# Patient Record
Sex: Male | Born: 1960 | Race: Black or African American | Hispanic: No | Marital: Single | Smoking: Current every day smoker
Health system: Southern US, Community
[De-identification: ages and names within clinical notes are randomized; demographics above are authoritative.]

## PROBLEM LIST (undated history)

## (undated) DIAGNOSIS — M1990 Unspecified osteoarthritis, unspecified site: Secondary | ICD-10-CM

## (undated) DIAGNOSIS — Z915 Personal history of self-harm: Secondary | ICD-10-CM

## (undated) DIAGNOSIS — F1910 Other psychoactive substance abuse, uncomplicated: Secondary | ICD-10-CM

## (undated) DIAGNOSIS — F32A Depression, unspecified: Secondary | ICD-10-CM

## (undated) DIAGNOSIS — K219 Gastro-esophageal reflux disease without esophagitis: Secondary | ICD-10-CM

## (undated) DIAGNOSIS — Z9151 Personal history of suicidal behavior: Secondary | ICD-10-CM

## (undated) DIAGNOSIS — I10 Essential (primary) hypertension: Secondary | ICD-10-CM

## (undated) DIAGNOSIS — F329 Major depressive disorder, single episode, unspecified: Secondary | ICD-10-CM

## (undated) HISTORY — PX: HYPOSPADIAS CORRECTION: SHX483

---

## 1998-09-10 ENCOUNTER — Emergency Department (HOSPITAL_COMMUNITY): Admission: EM | Admit: 1998-09-10 | Discharge: 1998-09-10 | Payer: Self-pay | Admitting: Emergency Medicine

## 2006-07-03 ENCOUNTER — Inpatient Hospital Stay (HOSPITAL_COMMUNITY): Admission: EM | Admit: 2006-07-03 | Discharge: 2006-07-05 | Payer: Self-pay | Admitting: Emergency Medicine

## 2006-07-06 ENCOUNTER — Emergency Department (HOSPITAL_COMMUNITY): Admission: EM | Admit: 2006-07-06 | Discharge: 2006-07-06 | Payer: Self-pay | Admitting: Emergency Medicine

## 2009-11-23 ENCOUNTER — Emergency Department (HOSPITAL_COMMUNITY): Admission: EM | Admit: 2009-11-23 | Discharge: 2009-11-23 | Payer: Self-pay | Admitting: Emergency Medicine

## 2010-02-04 ENCOUNTER — Emergency Department (HOSPITAL_COMMUNITY): Admission: EM | Admit: 2010-02-04 | Discharge: 2010-02-04 | Payer: Self-pay | Admitting: Emergency Medicine

## 2010-09-13 LAB — DIFFERENTIAL
Basophils Absolute: 0 10*3/uL (ref 0.0–0.1)
Lymphocytes Relative: 33 % (ref 12–46)
Monocytes Absolute: 0.5 10*3/uL (ref 0.1–1.0)
Monocytes Relative: 8 % (ref 3–12)

## 2010-09-13 LAB — ETHANOL: Alcohol, Ethyl (B): <5 mg/dL (ref 0–10)

## 2010-09-13 LAB — CBC
HCT: 40.3 % (ref 39.0–52.0)
Hemoglobin: 13.3 g/dL (ref 13.0–17.0)
Platelets: 458 10*3/uL — ABNORMAL HIGH (ref 150–400)
RBC: 4.73 MIL/uL (ref 4.22–5.81)

## 2010-09-13 LAB — RAPID URINE DRUG SCREEN, HOSP PERFORMED
Barbiturates: NOT DETECTED
Benzodiazepines: POSITIVE — AB
Cocaine: POSITIVE — AB
Tetrahydrocannabinol: POSITIVE — AB

## 2010-09-13 LAB — POCT I-STAT, CHEM 8
BUN: 14 mg/dL (ref 6–23)
Chloride: 108 mEq/L (ref 96–112)
Creatinine, Ser: 1 mg/dL (ref 0.4–1.5)
Glucose, Bld: 86 mg/dL (ref 70–99)

## 2010-11-15 NOTE — Op Note (Signed)
NAMEJERMAIN, [name] NO.:  [id number]   MEDICAL RECORD NO.:  [id number]          PATIENT TYPE:  EMS   LOCATION:  MAJO                         FACILITY:  [facility]   PHYSICIAN:  Vanita Panda. Magnus Ivan, M.D.DATE OF BIRTH:  Jun 22, 1961   DATE OF PROCEDURE:  07/03/2006  DATE OF DISCHARGE:                               OPERATIVE REPORT   PREOPERATIVE DIAGNOSIS:  Chain saw injury to the left knee with open  proximal tibia fracture and ruptured patellar tendon.   POSTOPERATIVE DIAGNOSIS:  Chain saw injury to the left knee with open  proximal tibia fracture and ruptured patellar tendon.   PROCEDURE:  1. Thorough irrigation and debridement of left open knee wound status      post chain saw injury.  2. Direct primary repair of ruptured patella tendon, left knee.   SURGEON:  Vanita Panda. Magnus Ivan, M.D.   ANESTHESIA:  General.   ANTIBIOTIC:  600 mg IV clindamycin.   BLOOD LOSS:  50 mL.   COMPLICATIONS:  None.   INDICATIONS:  Briefly, [name] [name] is a 50 year old who was cutting  trees with a chain saw today when the chain saw jerked back and he  sustained a chain saw injury to his left knee.  This was an open  contaminated wound.  He was seen in the emergency room. He had a small  chunk of bone taken out the anterior cortex of the proximal tibia near  the plateau but outside of the joint.  This did not propagate the  fracture.  However, there was a complete laceration of the patellar  tendon at this level.  There was gross contamination of the wound.  It  was recommended he undergo thorough irrigation and debridement with  repair of the patellar tendon.  There was no surgical repair warranted  of the anterior cortex injury of the proximal tibia.  The risks and  benefits of this were explained to him and he understood and agreed to  proceed with surgery.   PROCEDURE DESCRIPTION:  After informed consent was obtained, the  appropriate left leg was marked, Mr.  [name] was brought to the  operating room and placed supine on the operating table.  General  anesthesia was then obtained. A nonsterile tourniquet was placed around  his upper left leg but was never utilized during the case.  His leg was  prepped and draped with Betadine scrub and paint as well as a sterile  stockinette and sterile dressings.  I then used forceps to take out  debris from the wound.  Once this was thoroughly done I copiously  irrigated the wound using pulsatile lavage and 1000 mL of bacitracin  solution followed by 3 liters of normal saline solution using pulsatile  lavage, as well.  Once this was thoroughly cleaned, it was noted that  the laceration was in the distal mid substance of the patellar tendon  with its attachments still maintained to the bone.  I was able to run a  #2 FiberWire suture in an interrupted format to bring the ends of the  tendon together securely. This was then oversewn with  0  Vicryl suture on the margins.  The wound was copiously irrigated again  and I closed the skin with interrupted 2-0 nylon suture.  Xeroform  followed by a well-padded sterile dressing were applied and the  patient's knee was placed in a knee immobilizer.  He was awakened,  extubated, and taken to the recovery room in stable condition.           ______________________________  Vanita Panda. Magnus Ivan, M.D.     CYB/MEDQ  D:  07/03/2006  T:  07/03/2006  Job:  [id number]

## 2010-11-15 NOTE — Discharge Summary (Signed)
NAMEOSWIN, GRIFFITH NO.:  [id number]   MEDICAL RECORD NO.:  [id number]          PATIENT TYPE:  INP   LOCATION:  5030                         FACILITY:  [facility]   PHYSICIAN:  Vanita Panda. Magnus Ivan, M.D.DATE OF BIRTH:  [birth year]   DATE OF ADMISSION:  07/03/2006  DATE OF DISCHARGE:  07/05/2006                               DISCHARGE SUMMARY   ADMITTING DIAGNOSES:  Chainsaw injury to left knee with open proximal  tibia fracture and ruptured patellar tendon.   DISCHARGE DIAGNOSES:  Chainsaw injury to left knee with open proximal  tibia fracture and ruptured patellar tendon.   PROCEDURES:  1. Left knee wound exploration.  2. Thorough irrigation and debridement of left knee wound.  3. Direct primary repair of ruptured left patellar tendon.   HOSPITAL COURSE:  Mr. Schrieber is a 50 year old who was admitted on  January 4 after having a change saw injury to his left knee.  He  sustained the aforementioned injuries and was taken to the operating  room on the day of admission for cleaning the wound and repairing the  soft tissues. For description of the operation, please refer to the  dictated operative note in the patient's medical record.  Postoperatively, I admitted him for IV antibiotics and then kept him an  extra day due to the need for IV antibiotics from the gross  contamination of the wound. By the day of discharge, he was up with  physical therapy with a knee immobilizer and weightbearing as tolerated  but no bending of the knee.  The wound was assessed and found to be  clean, dry and intact, and he was stable for discharge to home.   DISPOSITION:  Home.   DISCHARGE MEDICATIONS:  1. Percocet as needed for pain.  2. Robaxin as needed for spasms.  3. Keflex 500 mg 4 times daily for 1 week.   DISCHARGE INSTRUCTIONS:  1. While at home, he can look at his wound daily to assess it for      drainage, and he should wear the knee immobilizer at all times,   especially when ambulating so as not to bend the knee.  2. Followup will be established in the office in 2 weeks.           ______________________________  Vanita Panda. Magnus Ivan, M.D.     CYB/MEDQ  D:  07/05/2006  T:  07/05/2006  Job:  [id number]

## 2010-11-15 NOTE — Discharge Summary (Signed)
[name], [name] NO.:  [id number]   MEDICAL RECORD NO.:  [id number]          PATIENT TYPE:  INP   LOCATION:  5030                         FACILITY:  [facility]   PHYSICIAN:  Vanita Panda. Magnus Ivan, M.D.DATE OF BIRTH:  09-Feb-1961   DATE OF ADMISSION:  07/03/2006  DATE OF DISCHARGE:  07/05/2006                               DISCHARGE SUMMARY   ADMITTING DIAGNOSIS:  Left open knee wound with patellar tendon rupture  status post chainsaw injury.   DISCHARGE DIAGNOSIS:  Left open knee wound with patellar tendon rupture  status post chainsaw injury.   PROCEDURES:  1. Irrigation and debridement of open left knee wound.  2. Left patellar tendon repair on the day of admission.   HOSPITAL COURSE:  [name] is a 50 year old who was cutting down  trees.  He slipped, and the chainsaw slipped, and he suffered an open  knee wound from the chainsaw.  He was found to have a complete ruptured  tendon with an open injury, so it was recommended he undergo irrigation  and debridement and fixation of his tendon injury.  The risks and  benefits of this were explained to him.  He well understood, and he  agreed to proceed with surgery.  He was taken to the operating room, and  an I and D was performed as well as a left patellar tendon repair.  For  a detailed description of the operation, please refer to the history and  physical and operative note on the patient in the record.  Postoperatively I will ask him to follow up with physical therapy and a  knee immobilizer with weightbearing as tolerated in the immobilizer.  He  remained on IV antibiotics until discharge.  He was transitioned to oral  antibiotics.  His hospital course was uneventful, and he was discharged  to home in stable condition.   DISPOSITION:  To home.   DISCHARGE MEDICATIONS:  Oral antibiotic and oral pain medications.   DISCHARGE INSTRUCTIONS:  While he is at home, dry dressing changes will  be  performed daily on his leg, so he can look at the wound.  He can  weight bear as tolerated in the knee immobilizer, but under no  circumstances should he bend his knee.  Follow up will be established in  my office in 2 weeks.           ______________________________  Vanita Panda. Magnus Ivan, M.D.     CYB/MEDQ  D:  08/07/2006  T:  08/08/2006  Job:  [id number]

## 2011-07-14 ENCOUNTER — Encounter (HOSPITAL_COMMUNITY): Payer: Self-pay | Admitting: Emergency Medicine

## 2011-07-14 ENCOUNTER — Emergency Department (HOSPITAL_COMMUNITY)
Admission: EM | Admit: 2011-07-14 | Discharge: 2011-07-14 | Disposition: A | Discharge status: Rehabilitation Facility | Payer: Self-pay | Attending: Emergency Medicine | Admitting: Emergency Medicine

## 2011-07-14 DIAGNOSIS — M129 Arthropathy, unspecified: Secondary | ICD-10-CM | POA: Insufficient documentation

## 2011-07-14 DIAGNOSIS — F1410 Cocaine abuse, uncomplicated: Secondary | ICD-10-CM | POA: Insufficient documentation

## 2011-07-14 DIAGNOSIS — F1910 Other psychoactive substance abuse, uncomplicated: Secondary | ICD-10-CM

## 2011-07-14 DIAGNOSIS — F1010 Alcohol abuse, uncomplicated: Secondary | ICD-10-CM | POA: Insufficient documentation

## 2011-07-14 HISTORY — DX: Unspecified osteoarthritis, unspecified site: M19.90

## 2011-07-14 LAB — RAPID URINE DRUG SCREEN, HOSP PERFORMED
Amphetamines: NOT DETECTED
Barbiturates: NOT DETECTED
Benzodiazepines: NOT DETECTED
Cocaine: POSITIVE — AB
Opiates: NOT DETECTED
Tetrahydrocannabinol: POSITIVE — AB

## 2011-07-14 LAB — POCT I-STAT, CHEM 8
BUN: 13 mg/dL (ref 6–23)
Chloride: 107 mEq/L (ref 96–112)
HCT: 44 % (ref 39.0–52.0)
TCO2: 24 mmol/L (ref 0–100)

## 2011-07-14 LAB — ETHANOL: Alcohol, Ethyl (B): <11 mg/dL (ref 0–11)

## 2011-07-14 MED ORDER — ACETAMINOPHEN 325 MG PO TABS: 650.0000 mg | ORAL_TABLET | ORAL | Status: DC | PRN

## 2011-07-14 MED ORDER — IBUPROFEN 200 MG PO TABS
600.0000 mg | ORAL_TABLET | Freq: Three times a day (TID) | ORAL | Status: DC | PRN
  Administered 2011-07-14: 600 mg via ORAL
  Filled 2011-07-14: qty 3

## 2011-07-14 MED ORDER — LORAZEPAM 1 MG PO TABS: 1.0000 mg | ORAL_TABLET | Freq: Three times a day (TID) | ORAL | Status: DC | PRN

## 2011-07-14 MED ORDER — ZOLPIDEM TARTRATE 5 MG PO TABS: 5.0000 mg | ORAL_TABLET | Freq: Every evening | ORAL | Status: DC | PRN

## 2011-07-14 MED ORDER — NICOTINE 21 MG/24HR TD PT24
21.0000 mg | MEDICATED_PATCH | Freq: Every day | TRANSDERMAL | Status: DC
  Administered 2011-07-14: 21 mg via TRANSDERMAL
  Filled 2011-07-14: qty 1

## 2011-07-14 MED ORDER — ALUM & MAG HYDROXIDE-SIMETH 200-200-20 MG/5ML PO SUSP: 30.0000 mL | ORAL | Status: DC | PRN

## 2011-07-14 MED ORDER — ONDANSETRON HCL 8 MG PO TABS: 4.0000 mg | ORAL_TABLET | Freq: Three times a day (TID) | ORAL | Status: DC | PRN

## 2011-07-14 NOTE — ED Notes (Signed)
Pt requesting detox from cocaine(crack), marijuana and ETOH; pt sts last use was yesterday; pt sts detoxed at [facility] 2 years ago

## 2011-07-14 NOTE — BH Assessment (Signed)
Assessment Note   [name] [name] is an 51 y.o. male that presents to the ED requesting detox from numerous substances, including ETOH, Cannibus, and Crack.  Pt was treated for same at [facility] in 2011 but never returned for outpatient follow-up.  Pt reports that use has worsened over last year and that he again needs inpatient treatment.  Pt denies SI, HI, or any active psychosis.  Pt is not followed by a Psychiatrist or therapist.  Pt does have legal charges, which include concealment of weapon and goes to court on January 29th.  Pt reports withdrawals including itchiness, irritability, and cravings.  His current CIWA score is 7.  Pt is able to contract for safety.   Please run for inpatient treatment.   Axis I: Rule out Substance dependence Axis II: Deferred Axis III:  Past Medical History  Diagnosis Date  . Arthritis    Axis IV: housing problems, problems related to legal system/crime, problems related to social environment, problems with access to health care services and problems with primary support group Axis V: 21-30 behavior considerably influenced by delusions or hallucinations OR serious impairment in judgment, communication OR inability to function in almost all areas  Past Medical History:  Past Medical History  Diagnosis Date  . Arthritis     History reviewed. No pertinent past surgical history.  Family History: History reviewed. No pertinent family history.  Social History:  reports that he has been smoking.  He does not have any smokeless tobacco history on file. He reports that he drinks alcohol. He reports that he uses illicit drugs (Cocaine and Marijuana).  Additional Social History:  Alcohol / Drug Use Pain Medications: 0 Prescriptions: 0 Over the Counter: 0 History of alcohol / drug use?: Yes Longest period of sobriety (when/how long): year Negative Consequences of Use: Financial;Legal;Personal relationships;Work / School Withdrawal Symptoms: Irritability;Patient  aware of relationship between substance abuse and physical/medical complications;Tingling Substance #1 Name of Substance 1: ETOH 1 - Age of First Use: teens 1 - Amount (size/oz): 8+ 1 - Frequency: QD 1 - Duration: year + 1 - Last Use / Amount: last nite Substance #2 Name of Substance 2: Cannibus 2 - Age of First Use: teen 2 - Amount (size/oz): up to 6 blunts 2 - Frequency: QD 2 - Duration: year + 2 - Last Use / Amount: last nite Substance #3 Name of Substance 3: Crack 3 - Age of First Use: 30 3 - Amount (size/oz): up to 1 gram 3 - Frequency: QD 3 - Duration: year + 3 - Last Use / Amount: last night Allergies: No Known Allergies  Home Medications:  No current facility-administered medications on file as of 07/14/2011.   No current outpatient prescriptions on file as of 07/14/2011.    OB/GYN Status:  No LMP for male patient.  General Assessment Data Location of Assessment: [facility] ED ACT Assessment: Yes Living Arrangements: Homeless Can pt return to current living arrangement?: No Admission Status: Voluntary Is patient capable of signing voluntary admission?: Yes Transfer from: Acute Hospital Referral Source: MD  Education Status Is patient currently in school?: No  Risk to self Suicidal Ideation: No Suicidal Intent: No Is patient at risk for suicide?: No Suicidal Plan?: No Access to Means: No What has been your use of drugs/alcohol within the last 12 months?: Cannibus, Alcohol, and Crack Previous Attempts/Gestures: No How many times?: 0  Other Self Harm Risks: impulsive, reckless Triggers for Past Attempts: Unpredictable Intentional Self Injurious Behavior: None Family Suicide History: No Recent  stressful life event(s): Financial Problems;Legal Issues;Conflict (Comment);Recent negative physical changes Persecutory voices/beliefs?: No Depression: Yes Depression Symptoms: Feeling worthless/self pity;Loss of interest in usual pleasures;Guilt Substance abuse history  and/or treatment for substance abuse?: Yes Suicide prevention information given to non-admitted patients: Not applicable  Risk to Others Homicidal Ideation: No Thoughts of Harm to Others: No Current Homicidal Intent: No Current Homicidal Plan: No Access to Homicidal Means: No History of harm to others?: Yes Assessment of Violence: In distant past Violent Behavior Description: charged with concealed weapons/ stole and fought to sustain habit Does patient have access to weapons?: Yes (Comment) Criminal Charges Pending?: Yes Describe Pending Criminal Charges: Concealed weapons charge Does patient have a court date: Yes Court Date: 07/29/11  Psychosis Hallucinations: None noted Delusions: None noted  Mental Status Report Eye Contact: Good Motor Activity: Unremarkable Speech: Logical/coherent Level of Consciousness: Alert Mood: Ambivalent Affect: Apathetic Anxiety Level: Minimal Thought Processes: Relevant Judgement: Impaired Orientation: Person;Place;Time;Situation Obsessive Compulsive Thoughts/Behaviors: Moderate  Cognitive Functioning Concentration: Decreased Memory: Recent Impaired;Remote Impaired IQ: Average Insight: Poor Impulse Control: Poor Appetite: Good Weight Loss: 0  Weight Gain: 0  Sleep: No Change Total Hours of Sleep: 5  Vegetative Symptoms: None  Prior Inpatient Therapy Prior Inpatient Therapy: Yes Prior Therapy Dates: 2011 Prior Therapy Facilty/Provider(s): [facility] Reason for Treatment: SA  Prior Outpatient Therapy Prior Outpatient Therapy: Yes Prior Therapy Dates: 2011 Prior Therapy Facilty/Provider(s): [facility] Reason for Treatment: SA            Values / Beliefs Cultural Requests During Hospitalization: None Spiritual Requests During Hospitalization: None        Additional Information 1:1 In Past 12 Months?: No CIRT Risk: No Elopement Risk: No Does patient have medical clearance?: Yes     Disposition:   Disposition Disposition of Patient: Referred to Patient referred to: Other (Comment);[facility]  On Site Evaluation by:   Reviewed with Physician:     Angelica Ran 07/14/2011 3:39 PM

## 2011-07-14 NOTE — ED Provider Notes (Signed)
8:25 PM is alert pleasant cooperative ambulatory, stable for discharge and transfer to Oley Balm, MD 07/14/11 2030

## 2011-07-14 NOTE — ED Provider Notes (Signed)
History     CSN: [id number]  Arrival date & time 07/14/11  4034   First MD Initiated Contact with Patient 07/14/11 1004      Chief Complaint  Patient presents with  . Medical Clearance    (Consider location/radiation/quality/duration/timing/severity/associated sxs/prior treatment) HPI A. she is here for detox from cocaine crack, and alcohol.  Patient states that he has got a long history of drug and alcohol abuse.  States he has had detox in the past [facility] 2 years ago.  Patient denies any other symptoms.  No chest pain, shortness of breath, weakness, vomiting, diarrhea, nausea, abdominal pain,  visual changes, shaking or hallucinations.  Past Medical History  Diagnosis Date  . Arthritis     History reviewed. No pertinent past surgical history.  History reviewed. No pertinent family history.  History  Substance Use Topics  . Smoking status: Current Everyday Smoker  . Smokeless tobacco: Not on file  . Alcohol Use: Yes      Review of Systems All pertinent positives and negatives reviewed in the history of present illness  Allergies  Review of patient's allergies indicates no known allergies.  Home Medications   Current Outpatient Rx  Name Route Sig Dispense Refill  . NYQUIL MULTI-SYMPTOM PO Oral Take 2 tablets by mouth once.      BP 141/88  Pulse 83  Temp(Src) 98.2 F (36.8 C) (Oral)  Resp 20  SpO2 94%  Physical Exam  Constitutional: He is oriented to person, place, and time. He appears well-developed and well-nourished. No distress.  HENT:  Head: Normocephalic and atraumatic.  Eyes: Pupils are equal, round, and reactive to light.  Neck: Normal range of motion. Neck supple.  Cardiovascular: Normal rate, regular rhythm and normal heart sounds.   Pulmonary/Chest: Effort normal and breath sounds normal.  Abdominal: Soft. Bowel sounds are normal. He exhibits no distension. There is no tenderness. There is no rebound and no guarding.  Neurological: He is alert  and oriented to person, place, and time.  Skin: Skin is dry. No rash noted.  Psychiatric: He has a normal mood and affect. His behavior is normal. Judgment and thought content normal.    ED Course  Procedures (including critical care time)  Labs Reviewed  URINE RAPID DRUG SCREEN (HOSP PERFORMED) - Abnormal; Notable for the following:    Cocaine POSITIVE (*)    Tetrahydrocannabinol POSITIVE (*)    All other components within normal limits  POCT I-STAT, CHEM 8 - Abnormal; Notable for the following:    Glucose, Bld 105 (*)    All other components within normal limits  ETHANOL  I-STAT, CHEM 8     Spoke with the accident the patient who will be in to see him in evaluating for possible admission for detox.     MDM  MDM Reviewed: nursing note and vitals Interpretation: labs            Carlyle Dolly, PA-C 07/14/11 1547

## 2011-07-14 NOTE — ED Notes (Signed)
Pt given Sprite to drink and crackers to eat, no further needs.

## 2011-07-14 NOTE — ED Notes (Signed)
[facility] transfer arranged by [facility] 

## 2011-07-14 NOTE — ED Notes (Signed)
[facility] transfer arranged by [facility]

## 2011-07-14 NOTE — ED Notes (Signed)
[facility] here to transport patient  Nicotine patch removed

## 2011-07-14 NOTE — ED Provider Notes (Signed)
Medical screening examination/treatment/procedure(s) were performed by non-physician practitioner and as supervising physician I was immediately available for consultation/collaboration.   Gwyneth Sprout, MD 07/14/11 1558

## 2011-07-14 NOTE — BHH Counselor (Signed)
Update @ 1830:  Called [facility] in reference to pt referral.  Per Arlys John, the nurse at [facility], pt accepted to [facility] and would be picked up from [facility] at 9 PM or 9:30 PM.  Updated EDP and ED staff, updated assessment disposition, completed assessment notification and faxed to [facility] to log.

## 2011-08-11 ENCOUNTER — Encounter (HOSPITAL_BASED_OUTPATIENT_CLINIC_OR_DEPARTMENT_OTHER): Payer: Self-pay | Admitting: *Deleted

## 2011-08-11 ENCOUNTER — Emergency Department (HOSPITAL_BASED_OUTPATIENT_CLINIC_OR_DEPARTMENT_OTHER)
Admission: EM | Admit: 2011-08-11 | Discharge: 2011-08-11 | Disposition: A | Discharge status: Home / Self Care | Payer: Self-pay | Attending: Emergency Medicine | Admitting: Emergency Medicine

## 2011-08-11 DIAGNOSIS — F17200 Nicotine dependence, unspecified, uncomplicated: Secondary | ICD-10-CM | POA: Insufficient documentation

## 2011-08-11 DIAGNOSIS — K922 Gastrointestinal hemorrhage, unspecified: Secondary | ICD-10-CM | POA: Insufficient documentation

## 2011-08-11 DIAGNOSIS — Z8739 Personal history of other diseases of the musculoskeletal system and connective tissue: Secondary | ICD-10-CM | POA: Insufficient documentation

## 2011-08-11 LAB — COMPREHENSIVE METABOLIC PANEL
ALT: 33 U/L (ref 0–53)
AST: 18 U/L (ref 0–37)
CO2: 24 mEq/L (ref 19–32)
Calcium: 10 mg/dL (ref 8.4–10.5)
Chloride: 106 mEq/L (ref 96–112)
Creatinine, Ser: 1 mg/dL (ref 0.50–1.35)
GFR calc Af Amer: >90 mL/min (ref >90)
GFR calc non Af Amer: 86 mL/min — ABNORMAL LOW (ref >90)
Glucose, Bld: 109 mg/dL — ABNORMAL HIGH (ref 70–99)
Total Bilirubin: 0.2 mg/dL — ABNORMAL LOW (ref 0.3–1.2)

## 2011-08-11 LAB — CBC
HCT: 39.4 % (ref 39.0–52.0)
Hemoglobin: 13.4 g/dL (ref 13.0–17.0)
MCH: 27.4 pg (ref 26.0–34.0)
MCV: 80.6 fL (ref 78.0–100.0)
Platelets: 515 10*3/uL — ABNORMAL HIGH (ref 150–400)
RBC: 4.89 MIL/uL (ref 4.22–5.81)
RDW: 14.4 % (ref 11.5–15.5)

## 2011-08-11 LAB — DIFFERENTIAL
Basophils Absolute: 0 10*3/uL (ref 0.0–0.1)
Eosinophils Relative: 2 % (ref 0–5)
Lymphocytes Relative: 29 % (ref 12–46)
Lymphs Abs: 2.4 10*3/uL (ref 0.7–4.0)
Monocytes Absolute: 0.8 10*3/uL (ref 0.1–1.0)
Neutro Abs: 4.8 10*3/uL (ref 1.7–7.7)

## 2011-08-11 MED ORDER — OMEPRAZOLE 20 MG PO CPDR: 20.0000 mg | DELAYED_RELEASE_CAPSULE | Freq: Every day | ORAL | Status: DC

## 2011-08-11 MED ORDER — KETOROLAC TROMETHAMINE 30 MG/ML IJ SOLN
INTRAMUSCULAR | Status: AC
  Filled 2011-08-11: qty 1

## 2011-08-11 MED ORDER — KETOROLAC TROMETHAMINE 60 MG/2ML IM SOLN
INTRAMUSCULAR | Status: AC
  Filled 2011-08-11: qty 2

## 2011-08-11 NOTE — ED Provider Notes (Signed)
History     CSN: [id number]  Arrival date & time 08/11/11  0825   First MD Initiated Contact with Patient 08/11/11 [phone]      Chief Complaint  Patient presents with  . Rectal Bleeding    (Consider location/radiation/quality/duration/timing/severity/associated sxs/prior treatment) Patient is a 51 y.o. male presenting with hematochezia. The history is provided by the patient.  Rectal Bleeding  Pertinent negatives include no abdominal pain, no diarrhea, no nausea, no vomiting, no chest pain, no headaches and no rash.   patient states she's had blood in the stool the last 2 weeks. It is both red in the bowl and in stool. He states he also has some with wiping. He states he was a little bit constipated and took some laxative to clear himself out. He states that the clear not in for 2 days he had no blood. It has since returned. No other bleeding. He was a substance abuser has been clean for about a month. He uses alcohol and other drugs heavily. No abdominal pain. No rectal pain.  Past Medical History  Diagnosis Date  . Arthritis     History reviewed. No pertinent past surgical history.  History reviewed. No pertinent family history.  History  Substance Use Topics  . Smoking status: Current Everyday Smoker  . Smokeless tobacco: Not on file  . Alcohol Use: Yes      Review of Systems  Constitutional: Negative for activity change and appetite change.  HENT: Negative for neck stiffness.   Eyes: Negative for pain.  Respiratory: Negative for chest tightness and shortness of breath.   Cardiovascular: Negative for chest pain and leg swelling.  Gastrointestinal: Positive for blood in stool, hematochezia and anal bleeding. Negative for nausea, vomiting, abdominal pain and diarrhea.  Genitourinary: Negative for flank pain.  Musculoskeletal: Negative for back pain.  Skin: Negative for rash.  Neurological: Negative for weakness, numbness and headaches.  Psychiatric/Behavioral: Negative  for behavioral problems.    Allergies  Review of patient's allergies indicates no known allergies.  Home Medications   Current Outpatient Rx  Name Route Sig Dispense Refill  . NYQUIL MULTI-SYMPTOM PO Oral Take 2 tablets by mouth once.      BP 153/83  Pulse 92  Temp(Src) 98.4 F (36.9 C) (Oral)  Resp 20  SpO2 99%  Physical Exam  Nursing note and vitals reviewed. Constitutional: He is oriented to person, place, and time. He appears well-developed and well-nourished.  HENT:  Head: Normocephalic and atraumatic.  Eyes: EOM are normal. Pupils are equal, round, and reactive to light.  Neck: Normal range of motion. Neck supple.  Cardiovascular: Normal rate, regular rhythm and normal heart sounds.   No murmur heard. Pulmonary/Chest: Effort normal and breath sounds normal.  Abdominal: Soft. Bowel sounds are normal. He exhibits no distension and no mass. There is no tenderness. There is no rebound and no guarding.       Scar on left upper abdomen from previous box cutter  Musculoskeletal: Normal range of motion. He exhibits no edema.  Neurological: He is alert and oriented to person, place, and time. No cranial nerve deficit.  Skin: Skin is warm and dry.  Psychiatric: He has a normal mood and affect.   rectal exam showed no gross blood. No hemorrhoids seen. No mass felt  ED Course  Procedures (including critical care time)  Labs Reviewed  CBC - Abnormal; Notable for the following:    Platelets 515 (*)    All other components within normal limits  COMPREHENSIVE METABOLIC PANEL - Abnormal; Notable for the following:    Glucose, Bld 109 (*)    Total Bilirubin 0.2 (*)    GFR calc non Af Amer 86 (*)    All other components within normal limits  DIFFERENTIAL  OCCULT BLOOD X 1 CARD TO LAB, STOOL   No results found.   1. GI bleed       MDM  GI bleeding. Hemoglobin is normal. Platelets is mildly elevated. No clear cause, but patient seems hemodynamically stable. He was  guaiac-negative in ER. He will followup with GI as needed        Juliet Rude. Rubin Payor, MD 08/11/11 [phone]

## 2011-08-11 NOTE — ED Notes (Signed)
Pt amb to room 5 with quick steady gait in nad. Pt reports 2 weeks of intermittant brb with painful stools. Pt denies any bleeding between bm's or any abd pain, fevers, n/v or other c/o

## 2012-06-21 ENCOUNTER — Emergency Department (HOSPITAL_COMMUNITY)
Admission: EM | Admit: 2012-06-21 | Discharge: 2012-06-22 | Disposition: A | Discharge status: Rehabilitation Facility | Payer: Self-pay | Attending: Emergency Medicine | Admitting: Emergency Medicine

## 2012-06-21 ENCOUNTER — Encounter (HOSPITAL_COMMUNITY): Payer: Self-pay | Admitting: *Deleted

## 2012-06-21 DIAGNOSIS — M545 Low back pain, unspecified: Secondary | ICD-10-CM | POA: Insufficient documentation

## 2012-06-21 DIAGNOSIS — F329 Major depressive disorder, single episode, unspecified: Secondary | ICD-10-CM | POA: Insufficient documentation

## 2012-06-21 DIAGNOSIS — F1020 Alcohol dependence, uncomplicated: Secondary | ICD-10-CM | POA: Insufficient documentation

## 2012-06-21 DIAGNOSIS — F1920 Other psychoactive substance dependence, uncomplicated: Secondary | ICD-10-CM | POA: Insufficient documentation

## 2012-06-21 DIAGNOSIS — F1910 Other psychoactive substance abuse, uncomplicated: Secondary | ICD-10-CM

## 2012-06-21 DIAGNOSIS — G8929 Other chronic pain: Secondary | ICD-10-CM | POA: Insufficient documentation

## 2012-06-21 DIAGNOSIS — F17200 Nicotine dependence, unspecified, uncomplicated: Secondary | ICD-10-CM | POA: Insufficient documentation

## 2012-06-21 DIAGNOSIS — Z8739 Personal history of other diseases of the musculoskeletal system and connective tissue: Secondary | ICD-10-CM | POA: Insufficient documentation

## 2012-06-21 DIAGNOSIS — Z79899 Other long term (current) drug therapy: Secondary | ICD-10-CM | POA: Insufficient documentation

## 2012-06-21 DIAGNOSIS — F3289 Other specified depressive episodes: Secondary | ICD-10-CM | POA: Insufficient documentation

## 2012-06-21 DIAGNOSIS — F1010 Alcohol abuse, uncomplicated: Secondary | ICD-10-CM

## 2012-06-21 LAB — COMPREHENSIVE METABOLIC PANEL
AST: 25 U/L (ref 0–37)
Albumin: 3.6 g/dL (ref 3.5–5.2)
BUN: 15 mg/dL (ref 6–23)
Creatinine, Ser: 1.07 mg/dL (ref 0.50–1.35)
Total Protein: 7.4 g/dL (ref 6.0–8.3)

## 2012-06-21 LAB — RAPID URINE DRUG SCREEN, HOSP PERFORMED
Benzodiazepines: NOT DETECTED
Cocaine: POSITIVE — AB
Opiates: NOT DETECTED

## 2012-06-21 LAB — CBC
HCT: 47 % (ref 39.0–52.0)
MCHC: 33.4 g/dL (ref 30.0–36.0)
MCV: 83.6 fL (ref 78.0–100.0)
Platelets: 551 10*3/uL — ABNORMAL HIGH (ref 150–400)
RDW: 15.1 % (ref 11.5–15.5)

## 2012-06-21 LAB — SALICYLATE LEVEL: Salicylate Lvl: <2 mg/dL — ABNORMAL LOW (ref 2.8–20.0)

## 2012-06-21 LAB — ETHANOL: Alcohol, Ethyl (B): <11 mg/dL (ref 0–11)

## 2012-06-21 LAB — ACETAMINOPHEN LEVEL: Acetaminophen (Tylenol), Serum: <15 ug/mL (ref 10–30)

## 2012-06-21 MED ORDER — THIAMINE HCL 100 MG/ML IJ SOLN: 100.0000 mg | Freq: Every day | INTRAMUSCULAR | Status: DC

## 2012-06-21 MED ORDER — LORAZEPAM 1 MG PO TABS: 1.0000 mg | ORAL_TABLET | Freq: Four times a day (QID) | ORAL | Status: DC | PRN

## 2012-06-21 MED ORDER — LORAZEPAM 2 MG/ML IJ SOLN: 1.0000 mg | Freq: Four times a day (QID) | INTRAMUSCULAR | Status: DC | PRN

## 2012-06-21 MED ORDER — ADULT MULTIVITAMIN W/MINERALS CH
1.0000 | ORAL_TABLET | Freq: Every day | ORAL | Status: DC
  Administered 2012-06-21 – 2012-06-22 (×2): 1 via ORAL
  Filled 2012-06-21 (×2): qty 1

## 2012-06-21 MED ORDER — VITAMIN B-1 100 MG PO TABS
100.0000 mg | ORAL_TABLET | Freq: Every day | ORAL | Status: DC
  Administered 2012-06-21 – 2012-06-22 (×2): 100 mg via ORAL
  Filled 2012-06-21 (×3): qty 1

## 2012-06-21 MED ORDER — FOLIC ACID 1 MG PO TABS
1.0000 mg | ORAL_TABLET | Freq: Every day | ORAL | Status: DC
  Administered 2012-06-21 – 2012-06-22 (×2): 1 mg via ORAL
  Filled 2012-06-21 (×2): qty 1

## 2012-06-21 MED ORDER — ONDANSETRON HCL 8 MG PO TABS: 4.0000 mg | ORAL_TABLET | Freq: Three times a day (TID) | ORAL | Status: DC | PRN

## 2012-06-21 NOTE — ED Notes (Addendum)
Pt reports wanting to detox from marijuana, EtOH, and crack; pt reports starting to use at 16 with his usage progressively getting worse in the past couple of years;  Pt reports using every day with last usage reported at 2-3 this morning; pt used crack, marijuana and EtOH; pt cooperative with assessment; pt also reports SI; stated that "I tried to end it this morning using more than I should; I stepped on my drugs instead and decided I needed help; do not want to continue using like this anymore"; [facility] notified of sitter, will provide one when possible; pt has been wanded and placed in paper scrubs; MD notified;

## 2012-06-21 NOTE — ED Notes (Signed)
Pt escorted to the restroom with UA cup;

## 2012-06-21 NOTE — ED Provider Notes (Signed)
History   This chart was scribed for Hurman Horn, MD by Leone Payor, ED Scribe. This patient was seen in room C22C/C22C and the patient's care was started at 1906.   CSN: [id number]  Arrival date & time 06/21/12  1621   First MD Initiated Contact with Patient 06/21/12 1906      Chief Complaint  Patient presents with  . Medical Clearance  . Addiction Problem     The history is provided by the patient. No language interpreter was used.    [name] [name] is a 51 y.o. male who presents to the Emergency Department requesting a new detox from alcohol, crack cocaine, and marijuana use starting today. Pt states last use of substances was 17 hours ago. Pt reports going through an alcohol detox in the past 1-2 years. Pt has vague suicidal thoughts without a plan, but thinks that if he did die it would be okay with him. He denies HI, hallucinations, chest pain, SOB, abdominal pain.   Pt also complains of left ear pain starting 1 month ago. He states he feels as if something has "crawled inside".   Pt also complains of chronic lower back pain starting a few months ago. He denies changes in bowel or bladder function, fever, IV DA, weakness, numbness. Pt denies heart attacks, emphysema, DM, HTN or other medical problems. Pt is a current everyday smoker and regular alcohol user. Past Medical History  Diagnosis Date  . Arthritis     History reviewed. No pertinent past surgical history.  History reviewed. No pertinent family history.  History  Substance Use Topics  . Smoking status: Current Every Day Smoker  . Smokeless tobacco: Not on file  . Alcohol Use: Yes      Review of Systems 10 Systems reviewed and are negative for acute change except as noted in the HPI.   Allergies  Review of patient's allergies indicates no known allergies.  Home Medications   Current Outpatient Rx  Name  Route  Sig  Dispense  Refill  . ACETAMINOPHEN 325 MG PO TABS   Oral   Take 650 mg by mouth  every 6 (six) hours as needed. For headache         . OMEPRAZOLE 20 MG PO CPDR   Oral   Take 20 mg by mouth daily.           BP 131/88  Pulse 70  Temp 98.4 F (36.9 C) (Oral)  Resp 14  Ht 5' 10.5" (1.791 m)  Wt 200 lb (90.719 kg)  BMI 28.29 kg/m2  SpO2 97%  Physical Exam  Nursing note and vitals reviewed. Constitutional:       Awake, alert, nontoxic appearance with baseline speech for patient.  HENT:  Head: Atraumatic.  Mouth/Throat: No oropharyngeal exudate.       Rt ear drum is clear. Both EAC were clear. Left ear drum appears to have some apparent white scar tissue but no redness or pus. No erythema or swelling around left ear. No change in hearing.   Eyes: EOM are normal. Pupils are equal, round, and reactive to light. Right eye exhibits no discharge. Left eye exhibits no discharge.  Neck: Neck supple.  Cardiovascular: Normal rate, regular rhythm and normal heart sounds.   No murmur heard. Pulmonary/Chest: Effort normal and breath sounds normal. No stridor. No respiratory distress. He has no wheezes. He has no rales. He exhibits no tenderness.  Abdominal: Soft. Bowel sounds are normal. He exhibits no mass. There is  no tenderness. There is no rebound.  Musculoskeletal: He exhibits no tenderness.       Baseline ROM, moves extremities with no obvious new focal weakness.  Mild diffuse lumbar and para-lumbar tenderness.     Lymphadenopathy:    He has no cervical adenopathy.  Neurological:       Awake, alert, cooperative and aware of situation; motor strength bilaterally; sensation normal to light touch bilaterally; peripheral visual fields full to confrontation; no facial asymmetry; tongue midline; major cranial nerves appear intact; no pronator drift, normal finger to nose bilaterally, baseline gait without new ataxia.  Skin: No rash noted.  Psychiatric: He has a normal mood and affect.    ED Course  Procedures (including critical care time)  DIAGNOSTIC  STUDIES: Oxygen Saturation is 97% on room air, normal by my interpretation.    COORDINATION OF CARE:  7:15PM Patient / Family / Caregiver understand and agree with initial ED impression and plan with expectations set for ED visit.  ACT to see Pt; Dispo pnd.  Labs Reviewed  CBC - Abnormal; Notable for the following:    Platelets 551 (*)     All other components within normal limits  COMPREHENSIVE METABOLIC PANEL - Abnormal; Notable for the following:    Glucose, Bld 158 (*)     Total Bilirubin 0.2 (*)     GFR calc non Af Amer 79 (*)     All other components within normal limits  SALICYLATE LEVEL - Abnormal; Notable for the following:    Salicylate Lvl <2.0 (*)     All other components within normal limits  URINE RAPID DRUG SCREEN (HOSP PERFORMED) - Abnormal; Notable for the following:    Cocaine POSITIVE (*)     Tetrahydrocannabinol POSITIVE (*)     All other components within normal limits  ACETAMINOPHEN LEVEL  ETHANOL  LAB REPORT - SCANNED   No results found.   1. Alcohol abuse   2. Substance abuse   3. Depression       MDM  I personally performed the services described in this documentation, which was scribed in my presence. The recorded information has been reviewed and is accurate.    Hurman Horn, MD 06/24/12 [phone]

## 2012-06-21 NOTE — ED Notes (Addendum)
Pt here requesting detox from ETOH, crack cocaine and marijuana; pt sts last use was last night 0200; pt c/o left ear pain

## 2012-06-22 NOTE — ED Notes (Signed)
Report received from [place], Charity fundraiser.

## 2012-06-22 NOTE — BH Assessment (Signed)
[facility] Assessment Progress Note      Called [facility], spoke with Shayla to do prescreen @ 0915.  [facility] to call back with disposition.

## 2012-06-22 NOTE — BH Assessment (Signed)
Assessment Note   Patient is a 51 year old African American male requesting detox from alcohol, crack cocaine, and marijuana.  Patient reports that he spends a couple hundred dollars on drugs daily.  Patient reports that he last used on 06-21-2012 at 11:30 p.m.  Patient does not remember the amount that he used.  Patient reports that he uses, "as much as can get."  Patients UDS was positive for cocaine and marijuana.  Patient had a CIWA score of 51 years old.  Patients BAL was <11.    Patient reports abusing crack since he was 51 years old, alcohol since he was 51 years old and marijuana since he was 51 year old.  Patient reports that he uses all three drugs on a daily basis.  Patient has reported an increase in his drug usage since his mother and sister died a month apart.    Patient reports withdrawal symptoms that include tingling, fever and chills, weakness, irritability, agitation and restlessness in his legs.     Patient reports feeling of depression and hopelessness associate with his inability to stop using drugs. Patient reports feelings of loss of interest in usual pleasures, insomnia, despondent, isolating himself from friends and family members, feelings of worthlessness/self-pity and feeling and irritable.    Patient reports feelings of stress regarding his court case on July 12, 2012 in traffic court for driving without a license.    Patient reports attempting previous detox and treatment in 2011 at [facility] for 50 days and [facility] for 2 weeks.  In 2010 Patient reports that he received treatment at [facility] in [place].    Patient denies SI/HI.  Patient denies psychosis.  Patient denies any previous psychiatric hospitalizations.   Patient denies medication management or mental health therapy.      Axis I: 304.80 Polysubstance Dependence and Depressive Disorder  Axis II: Deferred Axis III:  Past Medical History  Diagnosis Date  . Arthritis    Axis IV: economic problems,  housing problems, occupational problems, problems related to legal system/crime and problems with primary support group Axis V: 41-50 serious symptoms  Past Medical History:  Past Medical History  Diagnosis Date  . Arthritis     History reviewed. No pertinent past surgical history.  Family History: History reviewed. No pertinent family history.  Social History:  reports that he has been smoking.  He does not have any smokeless tobacco history on file. He reports that he drinks alcohol. He reports that he uses illicit drugs (Cocaine and Marijuana).  Additional Social History:  Alcohol / Drug Use History of alcohol / drug use?: Yes Substance #1 Name of Substance 1: Cocaine  1 - Age of First Use: 20 1 - Amount (size/oz): varies  1 - Frequency: daily 1 - Duration: since he was 51 yrs old  1 - Last Use / Amount: unable to remember  Substance #2 Name of Substance 2: Marijuanna  2 - Age of First Use: 14 2 - Amount (size/oz): varies  2 - Frequency: Daily 2 - Duration: Since he was 51 years old.  2 - Last Use / Amount: 06-21-2012 Substance #3 Name of Substance 3: Alcohol  3 - Age of First Use: 51 years old  3 - Amount (size/oz): varies  3 - Frequency: Daily  3 - Duration: sice he was 51 years old  3 - Last Use / Amount: 06-21-2012  CIWA: CIWA-Ar BP: 129/72 mmHg Pulse Rate: 73  Nausea and Vomiting: no nausea and no  vomiting Tactile Disturbances: very mild itching, pins and needles, burning or numbness Tremor: not visible, but can be felt fingertip to fingertip Auditory Disturbances: not present Paroxysmal Sweats: barely perceptible sweating, palms moist Visual Disturbances: very mild sensitivity Anxiety: two Headache, Fullness in Head: very mild Agitation: two Orientation and Clouding of Sensorium: oriented and can do serial additions CIWA-Ar Total: 9  COWS: Clinical Opiate Withdrawal Scale (COWS) Resting Pulse Rate: Pulse Rate 81-100 Sweating: No report of chills or  flushing Restlessness: Reports difficulty sitting still, but is able to do so Pupil Size: Pupils pinned or normal size for room light Bone or Joint Aches: Mild diffuse discomfort Runny Nose or Tearing: Not present GI Upset: No GI symptoms Tremor: No tremor Yawning: No yawning Anxiety or Irritability: Patient reports increasing irritability or anxiousness Gooseflesh Skin: Skin is smooth COWS Total Score: 4   Allergies: No Known Allergies  Home Medications:  (Not in a hospital admission)  OB/GYN Status:  No LMP for male patient.  General Assessment Data Location of Assessment: [facility] ED ACT Assessment: Yes Living Arrangements: Other (Comment) Can pt return to current living arrangement?: Yes Admission Status: Voluntary Is patient capable of signing voluntary admission?: Yes Transfer from: Acute Hospital Referral Source: Self/Family/Friend  Education Status Is patient currently in school?: No  Risk to self Suicidal Ideation: No Suicidal Intent: No Is patient at risk for suicide?: No Suicidal Plan?: No Access to Means: No What has been your use of drugs/alcohol within the last 12 months?: Crack cocaine, alcohol, marijuanna Previous Attempts/Gestures: No How many times?: 0  Other Self Harm Risks: No Triggers for Past Attempts: None known Intentional Self Injurious Behavior: None Family Suicide History: No Recent stressful life event(s): Conflict (Comment);Financial Problems;Turmoil (Comment) Persecutory voices/beliefs?: No Depression: Yes Depression Symptoms: Despondent;Isolating;Guilt;Loss of interest in usual pleasures;Feeling worthless/self pity;Feeling angry/irritable Substance abuse history and/or treatment for substance abuse?: Yes Suicide prevention information given to non-admitted patients: Not applicable  Risk to Others Homicidal Ideation: No Thoughts of Harm to Others: No Current Homicidal Intent: No Current Homicidal Plan: No Access to Homicidal Means:  No Identified Victim: None Reported History of harm to others?: No Assessment of Violence: None Noted Violent Behavior Description: None  Does patient have access to weapons?: No Criminal Charges Pending?: Yes Describe Pending Criminal Charges: Driving without a license  Does patient have a court date: Yes Court Date: 07/27/12  Psychosis Hallucinations: None noted Delusions: None noted  Mental Status Report Appear/Hygiene: Disheveled Eye Contact: Fair Motor Activity: Freedom of movement Speech: Logical/coherent Level of Consciousness: Alert Mood: Depressed;Anxious;Despair Affect: Depressed Anxiety Level: Moderate Thought Processes: Coherent;Relevant Judgement: Unimpaired Orientation: Person;Place;Time;Situation Obsessive Compulsive Thoughts/Behaviors: None  Cognitive Functioning Concentration: Decreased Memory: Recent Intact;Remote Intact IQ: Average Insight: Fair Impulse Control: Poor Appetite: Fair Weight Loss: 0  Weight Gain: 0  Sleep: Decreased Total Hours of Sleep: 6  Vegetative Symptoms: None  ADLScreening [facility] Assessment Services) Patient's cognitive ability adequate to safely complete daily activities?: Yes Patient able to express need for assistance with ADLs?: Yes Independently performs ADLs?: Yes (appropriate for developmental age)  Abuse/Neglect [facility]) Physical Abuse: Denies Verbal Abuse: Denies Sexual Abuse: Denies  Prior Inpatient Therapy Prior Inpatient Therapy: Yes Prior Therapy Dates: 2011 Prior Therapy Facilty/Provider(s): [facility] Reason for Treatment: Detox and Treatment   Prior Outpatient Therapy Prior Outpatient Therapy: No Prior Therapy Dates: None  Prior Therapy Facilty/Provider(s): None  Reason for Treatment: None   ADL Screening (condition at time of admission) Patient's cognitive ability adequate to safely complete daily  activities?: Yes Patient able to express need for assistance with ADLs?: Yes Independently  performs ADLs?: Yes (appropriate for developmental age)       Abuse/Neglect Assessment (Assessment to be complete while patient is alone) Physical Abuse: Denies Verbal Abuse: Denies Sexual Abuse: Denies Values / Beliefs Cultural Requests During Hospitalization: None Spiritual Requests During Hospitalization: None        Additional Information 1:1 In Past 12 Months?: No CIRT Risk: No Elopement Risk: No Does patient have medical clearance?: Yes     Disposition: Patient referred to [facility].  Disposition Disposition of Patient: Referred to Patient referred to: [facility]  On Site Evaluation by:   Reviewed with Physician:     Phillip Heal LaVerne 06/22/2012 3:24 AM

## 2012-06-22 NOTE — BH Assessment (Addendum)
Assessment Note  Update:  Called [facility] to follow up with pt referral and gave prescreen @ 0915.  Per [facility] @ 1030, pt accepted to [facility] and [facility] will pick pt up from ED at 1100.  Updated EDP and ED staff.  Pt to be discharged to [facility].  Updated assessment disposition, completed assessment notification and faxed to [facility] to log.     Disposition:  Disposition Disposition of Patient: Inpatient treatment program Type of inpatient treatment program: Adult Patient referred to: [facility]  On Site Evaluation by:   Reviewed with Physician:  Delorse Limber, Rennis Harding 06/22/2012 10:51 AM

## 2012-09-18 ENCOUNTER — Encounter (HOSPITAL_BASED_OUTPATIENT_CLINIC_OR_DEPARTMENT_OTHER): Payer: Self-pay | Admitting: Emergency Medicine

## 2012-09-18 ENCOUNTER — Emergency Department (HOSPITAL_BASED_OUTPATIENT_CLINIC_OR_DEPARTMENT_OTHER)
Admission: EM | Admit: 2012-09-18 | Discharge: 2012-09-18 | Disposition: A | Discharge status: Home / Self Care | Payer: Self-pay | Attending: Emergency Medicine | Admitting: Emergency Medicine

## 2012-09-18 DIAGNOSIS — Z8739 Personal history of other diseases of the musculoskeletal system and connective tissue: Secondary | ICD-10-CM | POA: Insufficient documentation

## 2012-09-18 DIAGNOSIS — K219 Gastro-esophageal reflux disease without esophagitis: Secondary | ICD-10-CM | POA: Insufficient documentation

## 2012-09-18 DIAGNOSIS — Z79899 Other long term (current) drug therapy: Secondary | ICD-10-CM | POA: Insufficient documentation

## 2012-09-18 DIAGNOSIS — R1013 Epigastric pain: Secondary | ICD-10-CM | POA: Insufficient documentation

## 2012-09-18 DIAGNOSIS — F17200 Nicotine dependence, unspecified, uncomplicated: Secondary | ICD-10-CM | POA: Insufficient documentation

## 2012-09-18 HISTORY — DX: Gastro-esophageal reflux disease without esophagitis: K21.9

## 2012-09-18 HISTORY — DX: Other psychoactive substance abuse, uncomplicated: F19.10

## 2012-09-18 MED ORDER — GI COCKTAIL ~~LOC~~
30.0000 mL | Freq: Once | ORAL | Status: AC
  Administered 2012-09-18: 30 mL via ORAL
  Filled 2012-09-18: qty 30

## 2012-09-18 MED ORDER — IBUPROFEN 600 MG PO TABS: 600.0000 mg | ORAL_TABLET | Freq: Four times a day (QID) | ORAL | Status: DC | PRN

## 2012-09-18 MED ORDER — TRAMADOL HCL 50 MG PO TABS: 50.0000 mg | ORAL_TABLET | Freq: Four times a day (QID) | ORAL | Status: DC | PRN

## 2012-09-18 MED ORDER — ESOMEPRAZOLE MAGNESIUM 40 MG PO CPDR: 40.0000 mg | DELAYED_RELEASE_CAPSULE | Freq: Every day | ORAL | Status: DC

## 2012-09-18 NOTE — ED Notes (Signed)
Pt states he is having reflux which seems to getting worse.  Pt taking OTC Mylanta and Zantac but is not helping.  Pt states he used to take Prilosec in the past.  Pt from [facility].

## 2012-09-18 NOTE — ED Provider Notes (Signed)
History     CSN: [id number]  Arrival date & time 09/18/12  1026   First MD Initiated Contact with Patient 09/18/12 1057      Chief Complaint  Patient presents with  . Gastrophageal Reflux    (Consider location/radiation/quality/duration/timing/severity/associated sxs/prior treatment) HPI Comments: Patient has a history of reflux and was taking Prilosec in the past however he is an out of his Prilosec for a while and is been using over-the-counter medicines such as Zantac some Mylanta. He states over the last few days his reflux has been getting worse and the Zantac and Mylanta don't seem to be helping as well. He states it typically is worse after he eats certain foods such as pizza and spaghetti. He has a burning pain in his epigastric region and has a bitter taste in his mouth and occasional burping. He denies any chest tightness or shortness of breath. He states this feels like his past episodes of reflux however it's worse than it has been in the past. He denies any vomiting or hematemesis. He denies any blood in his stool or melena. He is currently being treated in March for substance abuse.  Patient is a 52 y.o. male presenting with GERD.  Gastrophageal Reflux Associated symptoms include abdominal pain. Pertinent negatives include no chest pain, no headaches and no shortness of breath.    Past Medical History  Diagnosis Date  . Arthritis   . GERD (gastroesophageal reflux disease)   . Substance abuse     No past surgical history on file.  No family history on file.  History  Substance Use Topics  . Smoking status: Current Every Day Smoker  . Smokeless tobacco: Not on file  . Alcohol Use: Yes      Review of Systems  Constitutional: Negative for fever, chills, diaphoresis and fatigue.  HENT: Negative for congestion, rhinorrhea and sneezing.   Eyes: Negative.   Respiratory: Negative for cough, chest tightness and shortness of breath.   Cardiovascular: Negative for  chest pain and leg swelling.  Gastrointestinal: Positive for abdominal pain. Negative for nausea, vomiting, diarrhea and blood in stool.  Genitourinary: Negative for frequency, hematuria, flank pain and difficulty urinating.  Musculoskeletal: Negative for back pain and arthralgias.  Skin: Negative for rash.  Neurological: Negative for dizziness, speech difficulty, weakness, numbness and headaches.    Allergies  Review of patient's allergies indicates no known allergies.  Home Medications   Current Outpatient Rx  Name  Route  Sig  Dispense  Refill  . ranitidine (ZANTAC) 150 MG tablet   Oral   Take 150 mg by mouth 2 (two) times daily.         . traZODone (DESYREL) 100 MG tablet   Oral   Take 100 mg by mouth at bedtime.         Marland Kitchen esomeprazole (NEXIUM) 40 MG capsule   Oral   Take 1 capsule (40 mg total) by mouth daily.   30 capsule   0     There were no vitals taken for this visit.  Physical Exam  Constitutional: He is oriented to person, place, and time. He appears well-developed and well-nourished.  HENT:  Head: Normocephalic and atraumatic.  Eyes: Pupils are equal, round, and reactive to light.  Neck: Normal range of motion. Neck supple.  Cardiovascular: Normal rate, regular rhythm and normal heart sounds.   Pulmonary/Chest: Effort normal and breath sounds normal. No respiratory distress. He has no wheezes. He has no rales. He exhibits no tenderness.  Abdominal: Soft. Bowel sounds are normal. There is no tenderness. There is no rebound and no guarding.  Musculoskeletal: Normal range of motion. He exhibits no edema.  Lymphadenopathy:    He has no cervical adenopathy.  Neurological: He is alert and oriented to person, place, and time.  Skin: Skin is warm and dry. No rash noted.  Psychiatric: He has a normal mood and affect.    ED Course  Procedures (including critical care time)  Labs Reviewed - No data to display No results found.   1. GERD (gastroesophageal  reflux disease)     Date: 09/18/2012  Rate: 92  Rhythm: normal sinus rhythm  QRS Axis: normal  Intervals: normal  ST/T Wave abnormalities: normal  Conduction Disutrbances:none  Narrative Interpretation:   Old EKG Reviewed: none available     MDM  Patient's symptoms seem consistent with gastroesophageal reflux disease. There is no other symptoms suggestive of acute coronary syndrome. He was given a GI cocktail with improvement of symptoms. I will give him a prescription for Nexium and advised him on an appropriate diet to help with reflux. I did give him a referral to a GI physician if his symptoms are not improving.        Rolan Bucco, MD 09/18/12 [phone]

## 2012-10-06 ENCOUNTER — Emergency Department (HOSPITAL_BASED_OUTPATIENT_CLINIC_OR_DEPARTMENT_OTHER)
Admission: EM | Admit: 2012-10-06 | Discharge: 2012-10-06 | Disposition: A | Discharge status: Home / Self Care | Payer: Self-pay | Attending: Emergency Medicine | Admitting: Emergency Medicine

## 2012-10-06 ENCOUNTER — Encounter (HOSPITAL_BASED_OUTPATIENT_CLINIC_OR_DEPARTMENT_OTHER): Payer: Self-pay

## 2012-10-06 DIAGNOSIS — R221 Localized swelling, mass and lump, neck: Secondary | ICD-10-CM | POA: Insufficient documentation

## 2012-10-06 DIAGNOSIS — K089 Disorder of teeth and supporting structures, unspecified: Secondary | ICD-10-CM | POA: Insufficient documentation

## 2012-10-06 DIAGNOSIS — K0889 Other specified disorders of teeth and supporting structures: Secondary | ICD-10-CM

## 2012-10-06 DIAGNOSIS — R220 Localized swelling, mass and lump, head: Secondary | ICD-10-CM | POA: Insufficient documentation

## 2012-10-06 DIAGNOSIS — R6884 Jaw pain: Secondary | ICD-10-CM | POA: Insufficient documentation

## 2012-10-06 DIAGNOSIS — Z8739 Personal history of other diseases of the musculoskeletal system and connective tissue: Secondary | ICD-10-CM | POA: Insufficient documentation

## 2012-10-06 DIAGNOSIS — F17200 Nicotine dependence, unspecified, uncomplicated: Secondary | ICD-10-CM | POA: Insufficient documentation

## 2012-10-06 DIAGNOSIS — Z79899 Other long term (current) drug therapy: Secondary | ICD-10-CM | POA: Insufficient documentation

## 2012-10-06 DIAGNOSIS — K029 Dental caries, unspecified: Secondary | ICD-10-CM | POA: Insufficient documentation

## 2012-10-06 DIAGNOSIS — K219 Gastro-esophageal reflux disease without esophagitis: Secondary | ICD-10-CM | POA: Insufficient documentation

## 2012-10-06 MED ORDER — PENICILLIN V POTASSIUM 250 MG PO TABS
500.0000 mg | ORAL_TABLET | Freq: Once | ORAL | Status: AC
  Administered 2012-10-06: 500 mg via ORAL
  Filled 2012-10-06: qty 2

## 2012-10-06 MED ORDER — IBUPROFEN 800 MG PO TABS: 800.0000 mg | ORAL_TABLET | Freq: Three times a day (TID) | ORAL | Status: DC

## 2012-10-06 MED ORDER — PENICILLIN V POTASSIUM 500 MG PO TABS: 500.0000 mg | ORAL_TABLET | Freq: Four times a day (QID) | ORAL | Status: DC

## 2012-10-06 MED ORDER — IBUPROFEN 800 MG PO TABS
800.0000 mg | ORAL_TABLET | Freq: Once | ORAL | Status: AC
  Administered 2012-10-06: 800 mg via ORAL
  Filled 2012-10-06: qty 1

## 2012-10-06 NOTE — ED Provider Notes (Signed)
History     CSN: [id number]  Arrival date & time 10/06/12  1028   First MD Initiated Contact with Patient 10/06/12 1104      Chief Complaint  Patient presents with  . Dental Pain    (Consider location/radiation/quality/duration/timing/severity/associated sxs/prior treatment) Patient is a 52 y.o. male presenting with tooth pain. The history is provided by the patient. No language interpreter was used.  Dental PainThe primary symptoms include mouth pain. Primary symptoms do not include fever. Episode onset: He had take off for a month, in the right upper jaw and the left upper posterior jaw. The symptoms are worsening. The symptoms occur intermittently.  Additional symptoms include: dental sensitivity to temperature, gum swelling, gum tenderness and jaw pain. Additional symptoms do not include: facial swelling. Associated medical issues comments: He is a patient of [facility] and is not supposed to take any narcotic pain medicine..    Past Medical History  Diagnosis Date  . Arthritis   . GERD (gastroesophageal reflux disease)   . Substance abuse     History reviewed. No pertinent past surgical history.  No family history on file.  History  Substance Use Topics  . Smoking status: Current Every Day Smoker  . Smokeless tobacco: Not on file  . Alcohol Use: Yes     Comment: in recovery- [facility]      Review of Systems  Constitutional: Negative for fever and chills.  HENT: Positive for dental problem. Negative for facial swelling.   Eyes: Negative.   Respiratory: Negative.   Cardiovascular: Negative.   Gastrointestinal: Negative.   Genitourinary: Negative.   Musculoskeletal: Negative.   Skin: Negative.   Neurological: Negative.   Psychiatric/Behavioral:       History of substance abuse.    Allergies  Review of patient's allergies indicates no known allergies.  Home Medications   Current Outpatient Rx  Name  Route  Sig  Dispense  Refill  . esomeprazole (NEXIUM) 40 MG  capsule   Oral   Take 1 capsule (40 mg total) by mouth daily.   30 capsule   0   . ibuprofen (ADVIL,MOTRIN) 800 MG tablet   Oral   Take 1 tablet (800 mg total) by mouth 3 (three) times daily.   21 tablet   0   . penicillin v potassium (VEETID) 500 MG tablet   Oral   Take 1 tablet (500 mg total) by mouth 4 (four) times daily.   20 tablet   0   . ranitidine (ZANTAC) 150 MG tablet   Oral   Take 150 mg by mouth 2 (two) times daily.         . traZODone (DESYREL) 100 MG tablet   Oral   Take 100 mg by mouth at bedtime.           BP 138/84  Pulse 102  Temp(Src) 98 F (36.7 C) (Oral)  Resp 18  Ht 5\' 10"  (1.778 m)  Wt 254 lb (115.214 kg)  BMI 36.45 kg/m2  SpO2 96%  Physical Exam  Nursing note and vitals reviewed. Constitutional: He is oriented to person, place, and time. He appears well-developed and well-nourished. No distress.  HENT:  Head: Normocephalic and atraumatic.  Right Ear: External ear normal.  Left Ear: External ear normal.  He has a DJD right upper second bicuspid tooth that has swelling around it. His left upper second molar is severely decayed, down to the gumline, with surrounding gingival swelling.  Neck: Normal range of motion. Neck supple.  Cardiovascular: Normal rate, regular rhythm and normal heart sounds.   Pulmonary/Chest: Effort normal and breath sounds normal.  Neurological: He is alert and oriented to person, place, and time.  No sensory or motor deficit.  Skin: Skin is warm and dry.  Psychiatric: He has a normal mood and affect. His behavior is normal.    ED Course  Procedures (including critical care time)  Treatment for dental infection with pen VK 500 mg 4 times a day for 5 days, and ibuprofen 800 mg every 4-6 hours as needed for pain. He will need dental extraction of these teeth.    1. Toothache         Carleene Cooper III, MD 10/06/12 1126

## 2012-10-06 NOTE — ED Notes (Signed)
Dental pain that started yesterday

## 2012-11-02 ENCOUNTER — Emergency Department: Payer: Self-pay | Admitting: Emergency Medicine

## 2012-11-02 LAB — PRO B NATRIURETIC PEPTIDE: B-Type Natriuretic Peptide: 11 pg/mL (ref 0–125)

## 2012-11-02 LAB — CBC
HCT: 35.8 % — ABNORMAL LOW (ref 40.0–52.0)
MCH: 27.3 pg (ref 26.0–34.0)
Platelet: 491 10*3/uL — ABNORMAL HIGH (ref 150–440)
RBC: 4.37 10*6/uL — ABNORMAL LOW (ref 4.40–5.90)
RDW: 14.6 % — ABNORMAL HIGH (ref 11.5–14.5)
WBC: 8.3 10*3/uL (ref 3.8–10.6)

## 2012-11-02 LAB — BASIC METABOLIC PANEL
Anion Gap: 5 — ABNORMAL LOW (ref 7–16)
Chloride: 109 mmol/L — ABNORMAL HIGH (ref 98–107)
Co2: 27 mmol/L (ref 21–32)
EGFR (Non-African Amer.): >60
Glucose: 120 mg/dL — ABNORMAL HIGH (ref 65–99)
Osmolality: 283 (ref 275–301)
Potassium: 3.9 mmol/L (ref 3.5–5.1)

## 2012-11-21 ENCOUNTER — Emergency Department: Payer: Self-pay | Admitting: Emergency Medicine

## 2013-07-07 ENCOUNTER — Emergency Department (HOSPITAL_COMMUNITY): Payer: Self-pay

## 2013-07-07 ENCOUNTER — Observation Stay (HOSPITAL_COMMUNITY)
Admission: EM | Admit: 2013-07-07 | Discharge: 2013-07-08 | Disposition: A | Discharge status: Home / Self Care | Payer: Self-pay | Attending: Internal Medicine | Admitting: Internal Medicine

## 2013-07-07 ENCOUNTER — Encounter (HOSPITAL_COMMUNITY): Payer: Self-pay | Admitting: Emergency Medicine

## 2013-07-07 DIAGNOSIS — I10 Essential (primary) hypertension: Secondary | ICD-10-CM | POA: Diagnosis present

## 2013-07-07 DIAGNOSIS — K219 Gastro-esophageal reflux disease without esophagitis: Secondary | ICD-10-CM | POA: Insufficient documentation

## 2013-07-07 DIAGNOSIS — R079 Chest pain, unspecified: Secondary | ICD-10-CM | POA: Diagnosis present

## 2013-07-07 DIAGNOSIS — F17200 Nicotine dependence, unspecified, uncomplicated: Secondary | ICD-10-CM | POA: Insufficient documentation

## 2013-07-07 DIAGNOSIS — Z23 Encounter for immunization: Secondary | ICD-10-CM | POA: Insufficient documentation

## 2013-07-07 DIAGNOSIS — F1910 Other psychoactive substance abuse, uncomplicated: Secondary | ICD-10-CM | POA: Diagnosis present

## 2013-07-07 DIAGNOSIS — R0602 Shortness of breath: Secondary | ICD-10-CM | POA: Insufficient documentation

## 2013-07-07 DIAGNOSIS — R0789 Other chest pain: Principal | ICD-10-CM | POA: Insufficient documentation

## 2013-07-07 DIAGNOSIS — F3289 Other specified depressive episodes: Secondary | ICD-10-CM | POA: Insufficient documentation

## 2013-07-07 DIAGNOSIS — R110 Nausea: Secondary | ICD-10-CM | POA: Insufficient documentation

## 2013-07-07 DIAGNOSIS — L301 Dyshidrosis [pompholyx]: Secondary | ICD-10-CM | POA: Insufficient documentation

## 2013-07-07 DIAGNOSIS — F32A Depression, unspecified: Secondary | ICD-10-CM | POA: Diagnosis present

## 2013-07-07 DIAGNOSIS — R059 Cough, unspecified: Secondary | ICD-10-CM | POA: Insufficient documentation

## 2013-07-07 DIAGNOSIS — R05 Cough: Secondary | ICD-10-CM | POA: Insufficient documentation

## 2013-07-07 DIAGNOSIS — F329 Major depressive disorder, single episode, unspecified: Secondary | ICD-10-CM | POA: Diagnosis present

## 2013-07-07 DIAGNOSIS — M129 Arthropathy, unspecified: Secondary | ICD-10-CM | POA: Insufficient documentation

## 2013-07-07 HISTORY — DX: Personal history of suicidal behavior: Z91.51

## 2013-07-07 HISTORY — DX: Major depressive disorder, single episode, unspecified: F32.9

## 2013-07-07 HISTORY — DX: Depression, unspecified: F32.A

## 2013-07-07 HISTORY — DX: Essential (primary) hypertension: I10

## 2013-07-07 HISTORY — DX: Personal history of self-harm: Z91.5

## 2013-07-07 LAB — LIPID PANEL
CHOLESTEROL: 165 mg/dL (ref 0–200)
HDL: 71 mg/dL (ref >39)
LDL Cholesterol: 77 mg/dL (ref 0–99)
Total CHOL/HDL Ratio: 2.3 RATIO
Triglycerides: 83 mg/dL (ref <150)
VLDL: 17 mg/dL (ref 0–40)

## 2013-07-07 LAB — URINALYSIS, ROUTINE W REFLEX MICROSCOPIC
Bilirubin Urine: NEGATIVE
Glucose, UA: NEGATIVE mg/dL
Hgb urine dipstick: NEGATIVE
KETONES UR: NEGATIVE mg/dL
Leukocytes, UA: NEGATIVE
Nitrite: NEGATIVE
PH: 5 (ref 5.0–8.0)
Protein, ur: NEGATIVE mg/dL
Specific Gravity, Urine: 1.02 (ref 1.005–1.030)
Urobilinogen, UA: 0.2 mg/dL (ref 0.0–1.0)

## 2013-07-07 LAB — COMPREHENSIVE METABOLIC PANEL
ALBUMIN: 3.6 g/dL (ref 3.5–5.2)
ALK PHOS: 58 U/L (ref 39–117)
ALT: 23 U/L (ref 0–53)
AST: 21 U/L (ref 0–37)
BUN: 9 mg/dL (ref 6–23)
CHLORIDE: 106 meq/L (ref 96–112)
CO2: 22 mEq/L (ref 19–32)
Calcium: 9.7 mg/dL (ref 8.4–10.5)
Creatinine, Ser: 0.86 mg/dL (ref 0.50–1.35)
GFR calc non Af Amer: >90 mL/min (ref >90)
GLUCOSE: 102 mg/dL — AB (ref 70–99)
POTASSIUM: 4.4 meq/L (ref 3.7–5.3)
Sodium: 141 mEq/L (ref 137–147)
Total Protein: 7 g/dL (ref 6.0–8.3)

## 2013-07-07 LAB — POCT I-STAT TROPONIN I: TROPONIN I, POC: 0.01 ng/mL (ref 0.00–0.08)

## 2013-07-07 LAB — RAPID URINE DRUG SCREEN, HOSP PERFORMED
Amphetamines: NOT DETECTED
BARBITURATES: NOT DETECTED
BENZODIAZEPINES: NOT DETECTED
Cocaine: POSITIVE — AB
Opiates: NOT DETECTED
TETRAHYDROCANNABINOL: POSITIVE — AB

## 2013-07-07 LAB — TROPONIN I: Troponin I: <0.3 ng/mL (ref <0.30)

## 2013-07-07 LAB — CBC
HEMATOCRIT: 44.2 % (ref 39.0–52.0)
HEMOGLOBIN: 15 g/dL (ref 13.0–17.0)
MCH: 28 pg (ref 26.0–34.0)
MCHC: 33.9 g/dL (ref 30.0–36.0)
MCV: 82.5 fL (ref 78.0–100.0)
Platelets: 491 10*3/uL — ABNORMAL HIGH (ref 150–400)
RBC: 5.36 MIL/uL (ref 4.22–5.81)
RDW: 15.8 % — ABNORMAL HIGH (ref 11.5–15.5)
WBC: 7.4 10*3/uL (ref 4.0–10.5)

## 2013-07-07 MED ORDER — HYDRALAZINE HCL 20 MG/ML IJ SOLN: 5.0000 mg | Freq: Four times a day (QID) | INTRAMUSCULAR | Status: DC | PRN

## 2013-07-07 MED ORDER — HYDRALAZINE HCL 20 MG/ML IJ SOLN
5.0000 mg | Freq: Once | INTRAMUSCULAR | Status: AC
  Administered 2013-07-07: 5 mg via INTRAVENOUS
  Filled 2013-07-07: qty 1

## 2013-07-07 MED ORDER — LORAZEPAM 1 MG PO TABS
1.0000 mg | ORAL_TABLET | Freq: Four times a day (QID) | ORAL | Status: DC | PRN
  Administered 2013-07-07 – 2013-07-08 (×2): 1 mg via ORAL
  Filled 2013-07-07 (×2): qty 1

## 2013-07-07 MED ORDER — THIAMINE HCL 100 MG/ML IJ SOLN
100.0000 mg | Freq: Every day | INTRAMUSCULAR | Status: DC
  Filled 2013-07-07: qty 1

## 2013-07-07 MED ORDER — ADULT MULTIVITAMIN W/MINERALS CH
1.0000 | ORAL_TABLET | Freq: Every day | ORAL | Status: DC
  Administered 2013-07-07 – 2013-07-08 (×2): 1 via ORAL
  Filled 2013-07-07 (×2): qty 1

## 2013-07-07 MED ORDER — ENOXAPARIN SODIUM 40 MG/0.4ML ~~LOC~~ SOLN
40.0000 mg | SUBCUTANEOUS | Status: DC
Start: 2013-07-07 — End: 2013-07-08
  Administered 2013-07-07: 40 mg via SUBCUTANEOUS
  Filled 2013-07-07 (×2): qty 0.4

## 2013-07-07 MED ORDER — VITAMIN B-1 100 MG PO TABS
100.0000 mg | ORAL_TABLET | Freq: Every day | ORAL | Status: DC
  Administered 2013-07-07 – 2013-07-08 (×2): 100 mg via ORAL
  Filled 2013-07-07 (×2): qty 1

## 2013-07-07 MED ORDER — SODIUM CHLORIDE 0.9 % IJ SOLN
3.0000 mL | Freq: Two times a day (BID) | INTRAMUSCULAR | Status: DC
  Administered 2013-07-08: 3 mL via INTRAVENOUS

## 2013-07-07 MED ORDER — LORAZEPAM 2 MG/ML IJ SOLN: 1.0000 mg | Freq: Four times a day (QID) | INTRAMUSCULAR | Status: DC | PRN

## 2013-07-07 MED ORDER — FOLIC ACID 1 MG PO TABS
1.0000 mg | ORAL_TABLET | Freq: Every day | ORAL | Status: DC
  Administered 2013-07-07 – 2013-07-08 (×2): 1 mg via ORAL
  Filled 2013-07-07 (×2): qty 1

## 2013-07-07 MED ORDER — ASPIRIN 325 MG PO TABS
325.0000 mg | ORAL_TABLET | ORAL | Status: AC
  Administered 2013-07-07: 325 mg via ORAL
  Filled 2013-07-07: qty 1

## 2013-07-07 NOTE — H&P (Signed)
Date: 07/07/2013               Patient Name:  [name] [name] MRN: [id number]  DOB: [birth year] Age / Sex: 53 y.o., male   PCP: Provider Default, MD         Medical Service: Internal Medicine Teaching Service         Attending Physician: Dr. Inez CatalinaEmily B Mullen, MD    First Contact: Dr. Darci Needlehikowski Pager: [phone]  Second Contact: Dr. Virgina OrganQureshi  Pager: [phone]       After Hours (After 5p/  First Contact Pager: [phone]  weekends / holidays): Second Contact Pager: [phone]   Chief Complaint: chest pain  History of Present Illness:  This is a 52yo AAM with PMH polysubstance abuse who presents with c/o sharp chest pain x this morning.   Patient reports that he "partied" last night and drank a large amount of alcohol, smoked marijuana, and approx 1-1.5g cocaine. Patient went to bed at around 2am and woke up around 7am. He was somewhat sweaty when he woke up, and when he stood up he had sudden onset L sided sharp CP that was constant for 10-7215mins. Pain was partially relieved when patient leaned over. He said he was breathing "fast" but did not have SOB. He remained sweaty, though unclear if the diaphoresis was associated with the chest pain. Patient reports that the CP resolved almost completely (to a dull, mild ache) and he went to work where he had another episode of identical chest pain. He sat down after which time the pain resolved, lasted approx 5-10 mins. CP is not pleuritic and is not worsened by movement. Pt has no chest pain now. He had no N/V, SOB during these episodes. He has had a cough for "weeks" now that is nonproductive and unchanged recently. Denies F/C, changes to appetite, dysuria, leg swelling, abd pain. He has had some watery, nonbloody diarrhea for the past few days, last normal stool was 4 days ago.  Also reports having some issues with starting his urine stream with ?dysuria when he starts his stream.   In the ED, patient was hypertensive 178/104, rest of VSS. CMP wnl. CBC wnl.  Trop neg x 1. UDS + cocaine and THC. CXR wnl.   Patient has poor social support and is interested in going to rehab for his drug addiction. [facility] He also endorses being depressed with transient intermittent thoughts of both SI and HI, though is not actively suicidal or homicidal now.  Meds: None  Allergies: Allergies as of 07/07/2013  . (No Known Allergies)   Past Medical History  Diagnosis Date  . Arthritis   . GERD (gastroesophageal reflux disease)   . Substance abuse     cocaine, marijuana, tobacco, alcohol  . Hypertension   . H/O suicide attempt     when he was young  . Depression    Past Surgical History  Procedure Laterality Date  . Hypospadias correction     Family History  Problem Relation Age of Onset  . Stroke Mother   . Diabetes Sister   . Diabetes Mother   . Stroke Father   . Hypertension Mother   . Hypertension Father   . Hypertension Sister   . Stroke Sister   . Heart disease Sister    History   Social History  . Marital Status: Single    Spouse Name: N/A    Number of Children: N/A  .  Years of Education: N/A   Occupational History  . Not on file.   Social History Main Topics  . Smoking status: Current Every Day Smoker -- 1.50 packs/day for 40 years    Types: Cigarettes  . Smokeless tobacco: Not on file  . Alcohol Use: Yes     Comment: in recovery- [facility], 2-3 40oz beer daily  . Drug Use: Yes    Special: Cocaine, Marijuana     Comment: in recovery- [facility], daily  . Sexual Activity: Not Currently    Partners: Female   Other Topics Concern  . Not on file   Social History Narrative   Works in home improvement   1 daughter but disconnected from family         Review of Systems: General: no fevers, chills, changes in weight, changes in appetite Skin: no rash HEENT: no blurry vision, hearing changes, sore throat Pulm: + cough no dyspnea, wheezing CV: see HPI Abd: +diarrhea;  no abdominal pain, nausea/vomiting, constipation GU: see HPI Ext: no arthralgias, myalgias Neuro: no weakness, numbness, or tingling  Physical Exam: Blood pressure 171/119, pulse 72, temperature 98.3 F (36.8 C), temperature source Oral, resp. rate 18, SpO2 99.00%. General: alert, cooperative, and in no apparent distress; became tearful when talking about family HEENT: pupils equal round and reactive to light, vision grossly intact, oropharynx clear and non-erythematous, MMM  Neck: supple Lungs: clear to ascultation bilaterally, normal work of respiration, no wheezes, rales, ronchi Heart: regular rate and rhythm, no murmurs, gallops, or rubs Abdomen: soft, non-tender, non-distended, normal bowel sounds Extremities: warm extremities, no BLE edema Psych: depressed mood, normal speech, normal though process and content- goal/future oriented; no current SI/HI Neurologic: alert & oriented X3, cranial nerves II-XII grossly intact, strength grossly intact, sensation intact to light touch  Lab results: Basic Metabolic Panel:  Recent Labs  16/10/96 1134  NA 141  K 4.4  CL 106  CO2 22  GLUCOSE 102*  BUN 9  CREATININE 0.86  CALCIUM 9.7   Liver Function Tests:  Recent Labs  07/07/13 1134  AST 21  ALT 23  ALKPHOS 58  BILITOT <0.2*  PROT 7.0  ALBUMIN 3.6   CBC:  Recent Labs  07/07/13 1134  WBC 7.4  HGB 15.0  HCT 44.2  MCV 82.5  PLT 491*   Urine Drug Screen: Drugs of Abuse     Component Value Date/Time   LABOPIA NONE DETECTED 07/07/2013 1431   COCAINSCRNUR POSITIVE* 07/07/2013 1431   LABBENZ NONE DETECTED 07/07/2013 1431   AMPHETMU NONE DETECTED 07/07/2013 1431   THCU POSITIVE* 07/07/2013 1431   LABBARB NONE DETECTED 07/07/2013 1431    Urinalysis:  Recent Labs  07/07/13 1431  COLORURINE YELLOW  LABSPEC 1.020  PHURINE 5.0  GLUCOSEU NEGATIVE  HGBUR NEGATIVE  BILIRUBINUR NEGATIVE  KETONESUR NEGATIVE  PROTEINUR NEGATIVE  UROBILINOGEN 0.2  NITRITE NEGATIVE    LEUKOCYTESUR NEGATIVE    Imaging results:  Dg Chest 2 View  07/07/2013   CLINICAL DATA:  Chest pain and shortness of breath today, history smoking  EXAM: CHEST  2 VIEW  COMPARISON:  02/04/2010  FINDINGS: Normal heart size, mediastinal contours, and pulmonary vascularity.  Lungs clear.  Bones unremarkable.  No pneumothorax.  IMPRESSION: Normal exam.   Electronically Signed   By: [name] M.D.   On: 07/07/2013 13:14   Other results: EKG: NSR, normal axis and intervals, elevated J point lead II and ? V2  Assessment & Plan by Problem:  # Atypical chest pain:  Patient's story is most consistent with atypical chest pain as it was sharp in nature, he has no h/o anginal chest pain, and he did not have clear associated s/s of ACS. However, patient has a strong family history of vascular disease and may have h/o uncontrolled HTN so we will rule out ACS. TIMI score 2 (8% risk at 14 days of all cause mortality, new or recurrent MI or recurrent ischemia). Will also risk stratify. -admit to tele for obs -EKG in am -cycle trops -HbA1c, TSH, lipid panel -CSW -consider daily ASA   # Depression in setting of polysubstance abuse:  Patient with depression that appears to be chronic and likely related to substance abuse. Not actively suicidal. Patient is interested in going to rehab and getting help. Will need close outpatient follow up. -consider psych consult -will need to arrange outpatient f/u -[facility] -HIV  # Elevated blood pressure: BPs elevated on admission and have been elevated in the past. Perhaps acutely elevated 2/2 cocaine use. Patient likely has chronic uncontrolled BPs. Will need to set up with a PCP for close outpatient follow up. S/p hydralazine 5mg  IV in the ED. -monitor BP -hydralazine 5mg  IV q6h prn for SPB >180 and DBP >105 -fundoscopy exam -consider HCTZ 12.5mg  PO if bps remain elevated   # VTE: lovenox  # Diet: regular  Code status: full  Dispo: Disposition is deferred at  this time, awaiting improvement of current medical problems. Anticipated discharge in approximately 1 day(s).   The patient does not have a current PCP (Provider Default, MD) and does need an [facility] hospital follow-up appointment after discharge.  The patient does not have transportation limitations that hinder transportation to clinic appointments.  Signed: Windell Hummingbird, MD 07/07/2013, 3:54 PM

## 2013-07-07 NOTE — ED Provider Notes (Signed)
CSN: [id number]     Arrival date & time 07/07/13  1123 History   First MD Initiated Contact with Patient 07/07/13 1204     Chief Complaint  Patient presents with  . Chest Pain   (Consider location/radiation/quality/duration/timing/severity/associated sxs/prior Treatment) Patient is a 53 y.o. male presenting with chest pain. The history is provided by the patient.  Chest Pain Pain location:  L chest Pain quality: sharp   Pain radiates to:  Does not radiate Pain radiates to the back: no   Pain severity:  Mild Onset quality:  Sudden Duration:  6 hours Timing:  Intermittent Progression:  Resolved Chronicity:  New Context comment:  After drug/etoh use Relieved by:  Nothing Worsened by:  Nothing tried Ineffective treatments:  None tried Associated symptoms: cough, diaphoresis, nausea and shortness of breath   Associated symptoms: no abdominal pain, no fever, no headache, no numbness and not vomiting     Past Medical History  Diagnosis Date  . Arthritis   . GERD (gastroesophageal reflux disease)   . Substance abuse    History reviewed. No pertinent past surgical history. No family history on file. History  Substance Use Topics  . Smoking status: Current Every Day Smoker  . Smokeless tobacco: Not on file  . Alcohol Use: Yes     Comment: in recovery- [facility]    Review of Systems  Constitutional: Positive for diaphoresis. Negative for fever.  HENT: Negative for drooling and rhinorrhea.   Eyes: Negative for pain.  Respiratory: Positive for cough and shortness of breath.   Cardiovascular: Positive for chest pain. Negative for leg swelling.  Gastrointestinal: Positive for nausea. Negative for vomiting, abdominal pain and diarrhea.  Genitourinary: Negative for dysuria and hematuria.  Musculoskeletal: Negative for gait problem and neck pain.  Skin: Negative for color change.  Neurological: Negative for numbness and headaches.  Hematological: Negative for adenopathy.   Psychiatric/Behavioral: Negative for behavioral problems.  All other systems reviewed and are negative.    Allergies  Review of patient's allergies indicates no known allergies.  Home Medications  No current outpatient prescriptions on file. BP 154/96  Pulse 76  Temp(Src) 98.3 F (36.8 C) (Oral)  Resp 20  SpO2 95% Physical Exam  Nursing note and vitals reviewed. Constitutional: He is oriented to person, place, and time. He appears well-developed and well-nourished.  HENT:  Head: Normocephalic and atraumatic.  Right Ear: External ear normal.  Left Ear: External ear normal.  Nose: Nose normal.  Mouth/Throat: Oropharynx is clear and moist. No oropharyngeal exudate.  Eyes: Conjunctivae and EOM are normal. Pupils are equal, round, and reactive to light.  Neck: Normal range of motion. Neck supple.  Cardiovascular: Normal rate, regular rhythm, normal heart sounds and intact distal pulses.  Exam reveals no gallop and no friction rub.   No murmur heard. Pulmonary/Chest: Effort normal and breath sounds normal. No respiratory distress. He has no wheezes.  Abdominal: Soft. Bowel sounds are normal. He exhibits no distension. There is no tenderness. There is no rebound and no guarding.  Musculoskeletal: Normal range of motion. He exhibits no edema and no tenderness.  Neurological: He is alert and oriented to person, place, and time.  Skin: Skin is warm and dry.  Psychiatric: He has a normal mood and affect. His behavior is normal.    ED Course  Procedures (including critical care time) Labs Review Labs Reviewed  CBC - Abnormal; Notable for the following:    RDW 15.8 (*)    Platelets 491 (*)  All other components within normal limits  COMPREHENSIVE METABOLIC PANEL - Abnormal; Notable for the following:    Glucose, Bld 102 (*)    Total Bilirubin <0.2 (*)    All other components within normal limits  URINE RAPID DRUG SCREEN (HOSP PERFORMED) - Abnormal; Notable for the following:     Cocaine POSITIVE (*)    Tetrahydrocannabinol POSITIVE (*)    All other components within normal limits  TROPONIN I  TROPONIN I  URINALYSIS, ROUTINE W REFLEX MICROSCOPIC  TSH  LIPID PANEL  HIV ANTIBODY (ROUTINE TESTING)  GLUCOSE, CAPILLARY  HEMOGLOBIN A1C  POCT I-STAT TROPONIN I   Imaging Review Dg Chest 2 View  07/07/2013   CLINICAL DATA:  Chest pain and shortness of breath today, history smoking  EXAM: CHEST  2 VIEW  COMPARISON:  02/04/2010  FINDINGS: Normal heart size, mediastinal contours, and pulmonary vascularity.  Lungs clear.  Bones unremarkable.  No pneumothorax.  IMPRESSION: Normal exam.   Electronically Signed   By: Ulyses SouthwardMark  Boles M.D.   On: 07/07/2013 13:14    EKG Interpretation    Date/Time:  Thursday July 07 2013 11:25:37 EST Ventricular Rate:  72 PR Interval:  146 QRS Duration: 80 QT Interval:  362 QTC Calculation: 396 R Axis:   19 Text Interpretation:  Sinus rhythm with marked sinus arrhythmia Otherwise normal ECG Confirmed by Leona Pressly  MD, Avira Tillison ([id number]) on 07/07/2013 12:31:20 PM            MDM   1. Chest pain   2. Hypertension    12:31 PM 53 y.o. male with a history of substance abuse who presents with left-sided sharp chest pain which has been intermittent since approximately 6 AM this morning. The patient has been abusing alcohol and drugs since July 2014. He states that last night he drank a wine cooler, several multiple malt beverages, a liquor drink as well as smoking crack cocaine and marijuana. He states that the binge and did sometime early in the morning approximately 2 to 3 AM. He woke up at 6 AM and noticed some left-sided chest pain some mild shortness of breath and some mild nausea. He also notes some mild diaphoresis at this time. He has had intermittent symptoms since that time and is currently asymptomatic. He states that his symptoms last several minutes at a time and sometimes up to 30 minutes. He is afebrile and vital signs are  unremarkable here. He has a family history of heart disease and is a smoker but denies any other cardiac risk factors. No formal diagnosis of hypertension. Will get screening lab work and give the patient an aspirin. I suspect his symptoms may be related to the cocaine use.  Pt w/out f/u. Now w/ increasing BP on exam. Will admit to internal medicine teaching service (unassigned) for cp r/o.    Junius ArgyleForrest S Glender [place], MD 07/08/13 1019

## 2013-07-07 NOTE — ED Notes (Signed)
Pt states that he diddrugs last night rock , dope etoh started to have cp today left sided some nausea

## 2013-07-08 ENCOUNTER — Encounter (HOSPITAL_COMMUNITY): Payer: Self-pay | Admitting: General Practice

## 2013-07-08 DIAGNOSIS — F329 Major depressive disorder, single episode, unspecified: Secondary | ICD-10-CM

## 2013-07-08 DIAGNOSIS — F3289 Other specified depressive episodes: Secondary | ICD-10-CM

## 2013-07-08 DIAGNOSIS — R072 Precordial pain: Secondary | ICD-10-CM

## 2013-07-08 DIAGNOSIS — R079 Chest pain, unspecified: Secondary | ICD-10-CM

## 2013-07-08 DIAGNOSIS — I10 Essential (primary) hypertension: Secondary | ICD-10-CM

## 2013-07-08 DIAGNOSIS — I517 Cardiomegaly: Secondary | ICD-10-CM

## 2013-07-08 DIAGNOSIS — F1410 Cocaine abuse, uncomplicated: Secondary | ICD-10-CM

## 2013-07-08 LAB — HEMOGLOBIN A1C
Hgb A1c MFr Bld: 6.3 % — ABNORMAL HIGH (ref <5.7)
MEAN PLASMA GLUCOSE: 134 mg/dL — AB (ref <117)

## 2013-07-08 LAB — TROPONIN I: Troponin I: <0.3 ng/mL (ref <0.30)

## 2013-07-08 LAB — TSH: TSH: 3.387 u[IU]/mL (ref 0.350–4.500)

## 2013-07-08 LAB — GLUCOSE, CAPILLARY: GLUCOSE-CAPILLARY: 84 mg/dL (ref 70–99)

## 2013-07-08 LAB — HIV ANTIBODY (ROUTINE TESTING W REFLEX): HIV: NONREACTIVE

## 2013-07-08 MED ORDER — INFLUENZA VAC SPLIT QUAD 0.5 ML IM SUSP
0.5000 mL | INTRAMUSCULAR | Status: AC
  Administered 2013-07-08: 0.5 mL via INTRAMUSCULAR
  Filled 2013-07-08: qty 0.5

## 2013-07-08 MED ORDER — PNEUMOCOCCAL VAC POLYVALENT 25 MCG/0.5ML IJ INJ
0.5000 mL | INJECTION | INTRAMUSCULAR | Status: AC
  Administered 2013-07-08: 0.5 mL via INTRAMUSCULAR
  Filled 2013-07-08: qty 0.5

## 2013-07-08 NOTE — Progress Notes (Signed)
Subjective: Patient had one episode of similar chest pain overnight that lasted 1-2 minutes and then resolved. Patient has some chest "soreness" that is tender to palpation.  Cardiology evaluated patient and performed echo and an echo stress test today.   Objective: Vital signs in last 24 hours: Filed Vitals:   07/07/13 1600 07/07/13 1612 07/07/13 2235 07/08/13 0617  BP: 156/92 156/92 140/73 127/76  Pulse: 56  65 64  Temp:   98.6 F (37 C) 98.7 F (37.1 C)  TempSrc:   Oral Oral  Resp: 20  18 18   SpO2: 95%  100% 100%   Weight change:  No intake or output data in the 24 hours ending 07/08/13 0651  Physical Exam General: alert, cooperative, and in no apparent distress HEENT: pupils equal round and reactive to light, vision grossly intact, oropharynx clear and non-erythematous, MMM  Neck: supple Lungs: clear to ascultation bilaterally, normal work of respiration, no wheezes, rales, ronchi Heart: regular rate and rhythm, no murmurs, gallops, or rubs Abdomen: soft, non-tender, non-distended, normal bowel sounds  Extremities: warm extremities, no BLE edema  Neurologic: alert & oriented X3, cranial nerves II-XII grossly intact, strength grossly intact, sensation intact to light touch  Lab Results: Basic Metabolic Panel:  Recent Labs Lab 07/07/13 1134  NA 141  K 4.4  CL 106  CO2 22  GLUCOSE 102*  BUN 9  CREATININE 0.86  CALCIUM 9.7   Liver Function Tests:  Recent Labs Lab 07/07/13 1134  AST 21  ALT 23  ALKPHOS 58  BILITOT <0.2*  PROT 7.0  ALBUMIN 3.6   CBC:  Recent Labs Lab 07/07/13 1134  WBC 7.4  HGB 15.0  HCT 44.2  MCV 82.5  PLT 491*   Cardiac Enzymes:  Recent Labs Lab 07/07/13 1857 07/07/13 2342  TROPONINI <0.30 <0.30   Fasting Lipid Panel:  Recent Labs Lab 07/07/13 1857  CHOL 165  HDL 71  LDLCALC 77  TRIG 83  CHOLHDL 2.3   Thyroid Function Tests:  Recent Labs Lab 07/07/13 1857  TSH 3.387   Urine Drug Screen: Drugs of Abuse       Component Value Date/Time   LABOPIA NONE DETECTED 07/07/2013 1431   COCAINSCRNUR POSITIVE* 07/07/2013 1431   LABBENZ NONE DETECTED 07/07/2013 1431   AMPHETMU NONE DETECTED 07/07/2013 1431   THCU POSITIVE* 07/07/2013 1431   LABBARB NONE DETECTED 07/07/2013 1431   Urinalysis:  Recent Labs Lab 07/07/13 1431  COLORURINE YELLOW  LABSPEC 1.020  PHURINE 5.0  GLUCOSEU NEGATIVE  HGBUR NEGATIVE  BILIRUBINUR NEGATIVE  KETONESUR NEGATIVE  PROTEINUR NEGATIVE  UROBILINOGEN 0.2  NITRITE NEGATIVE  LEUKOCYTESUR NEGATIVE   Studies/Results: Dg Chest 2 View  07/07/2013   CLINICAL DATA:  Chest pain and shortness of breath today, history smoking  EXAM: CHEST  2 VIEW  COMPARISON:  02/04/2010  FINDINGS: Normal heart size, mediastinal contours, and pulmonary vascularity.  Lungs clear.  Bones unremarkable.  No pneumothorax.  IMPRESSION: Normal exam.   Electronically Signed   By: Ulyses SouthwardMark  Boles M.D.   On: 07/07/2013 13:14   Medications: I have reviewed the patient's current medications. Scheduled Meds: . enoxaparin (LOVENOX) injection  40 mg Subcutaneous Q24H  . folic acid  1 mg Oral Daily  . multivitamin with minerals  1 tablet Oral Daily  . sodium chloride  3 mL Intravenous Q12H  . thiamine  100 mg Oral Daily   Or  . thiamine  100 mg Intravenous Daily   Continuous Infusions:  PRN Meds:.hydrALAZINE, LORazepam, LORazepam  Assessment/Plan:  # Atypical chest pain: Patient has some residual chest soreness, that may represent MSK pain given patient does a lot of physical labor at work. ACS ruled out w/ three negative troponins. TSH wnl. HIV ab nonreactive. HbA1c 6.3%. Lipid panel wnl, LDL 77. EKG with some early repolarization. Echo showed preserved EF 55-60%, with grade 1 diastolic dysfuction. Patient remains euvolemic. Patient had a normal stress echo today. -patient needs outpatient follow up with PCP, will arrange appt with [facility] -consider daily ASA   # Depression in setting of polysubstance  abuse: Patient with depression that appears to be chronic and likely related to substance abuse. Not actively suicidal. Patient is interested in going to rehab and getting help. Will need close outpatient follow up.  -CSW, will attempt to place patient in short term detox center -[facility]   # Elevated blood pressure: BPs elevated on admission, though BPs decreased significantly overnight (127/76). BPs likely acutely elevated 2/2 cocaine use. Patient may have uncontrolled HTN, though today it does not appear to be the case given normalized BP without treatment. Will need to set up with a PCP for close outpatient follow up. -monitor BP  -hydralazine 5mg  IV q6h prn for SPB >180 and DBP >105   # VTE: lovenox  # Diet: regular  Code status: full  Dispo: Discharge today if cardiology is agreeable.  The patient does not have a current PCP (Provider Default, MD) and does need an [facility] hospital follow-up appointment after discharge.  The patient does not have transportation limitations that hinder transportation to clinic appointments.  .Services Needed at time of discharge: Y = Yes, Blank = No PT:   OT:   RN:   Equipment:   Other:     LOS: 1 day   Windell Hummingbird, MD 07/08/2013, 6:51 AM

## 2013-07-08 NOTE — H&P (Signed)
  Date: 07/08/2013  Patient name: Earlie Louzeal Alberts  Medical record number: [id number]  Date of birth: Aug 24, 1960   I have seen and evaluated Atzin Metzer and discussed their care with the Residency Team.  Briefly, Mr. Debroah BallerDaughtrey is a 53 yo man with PMH of polysubstance abuse who presented with sharp chest pain after partying overnight with ingestion of ETOH, Marijuana and cocaine.  + diaphoresis.  The pain resolved to a dull ache which was reproducible and lasted about 5-10 minutes.  Further associated sx include cough for "weeks" and nonbloody diarrhea for about 4 days.    Assessment and Plan: I have seen and evaluated the patient as outlined above. I agree with the formulated Assessment and Plan as detailed in the residents' admission note, with the following changes:   1. Atypical chest pain: Possible related to cocaine ingestion with other co-ingestions.  He does have a TIMI score of 2 so will be admitted for an ACS rule out and risk stratification.  EKG, cycle CE, HbA1C, TSH, Lipid.  Agree with daily aspirin.  Would consider Cardiology consult.   2. Depression/polysubstance: He reports interest in a rehab placement for detox.  Will notify SW to see if they can assist.   3. Elevated BP: Possibly related to pain vs. Effects of cocaine.  PRN hydralazine ordered, consider oral meds if needed after initial evaluation.   Other issues per resident note.   Inez CatalinaEmily B Mullen, MD 1/9/201512:03 PM

## 2013-07-08 NOTE — Discharge Instructions (Signed)
Cocaine Abuse and Chemical Dependency °WHEN IS DRUG USE A PROBLEM? °Anytime drug use is interfering with normal living activities it has become abuse. This includes problems with family and friends. Psychological dependence has developed when your mind tells you that the drug is needed. This is usually followed by physical dependence which has developed when continuing increases of drug are required to get the same feeling or "high". This is known as addiction or chemical dependency. A person's risk is much higher if there is a history of chemical dependency in the family. °SIGNS OF CHEMICAL DEPENDENCY: °· Been told by friends or family that drugs have become a problem. °· Fighting when using drugs. °· Having blackouts (not remembering what you do while using). °· Feel sick from using drugs but continue using. °· Lie about use or amounts of drugs (chemicals) used. °· Need chemicals to get you going. °· Suffer in work performance or school because of drug use. °· Get sick from use of drugs but continue to use anyway. °· Need drugs to relate to people or feel comfortable in social situations. °· Use drugs to forget problems. °Yes answered to any of the above signs of chemical dependency indicates there are problems. The longer the use of drugs continues, the greater the problems will become. °If there is a family history of drug or alcohol use it is best not to experiment with these drugs. Experimentation leads to tolerance and needing to use more of the drug to get the same feeling. This is followed by addiction where drugs become the most important part of life. It becomes more important to take drugs than participate in the other usual activities of life including relating to friends and family. Addiction is followed by dependency where drugs are now needed not just to get high but to feel normal. °Addiction cannot be cured but it can be stopped. This often requires outside help and the care of professionals.  Treatment centers are listed in the yellow pages under: Cocaine, Narcotics, and Alcoholics Anonymous. Most hospitals and clinics can refer you to a specialized care center. °WHAT IS COCAINE? °Cocaine is a strong nervous system stimulant which speeds up the body and gives the user the feeling that they have increased energy, loss of appetite and feelings of great pleasure. This "high" which begins within several minutes and lasts for less than an hour is followed by a "crash". The crash and depressed feelings that come with it cause a craving for the drug to regain the high. °HOW IS COCANINE USED? °Cocaine is snorted, injected, and smoked as free- base or crack. Because smoking the drug produces a greater high it is also associated with a greater low. It is therefore more rapidly addicting. °WHAT ARE THE EFFECTS OF COCAINE? °It is an anesthetic (pain killer) and a stimulant (it causes a high which gives a false feeling of well being). It increases heart and breathing rates with increases in body temperature and blood pressure. It removes appetite. It causes seizures (convulsions) along with nausea (feeling sick to your stomach), vomiting and stomach pain. This dangerous combination can lead to death. Trying to keep the high feeling leads to greater and greater drug use and this leads to addiction. °Addiction can only be helped by stopping use of all chemicals. This is hard but may save your life. If the addiction is continued, the only possible outcome is loss of self respect and self esteem, violence, death, and eventually prison if the addict is fortunate   enough to be caught and able to receive help prior to this last life ending event. OTHER HEALTH RISKS OF COCAINE AND ALL DRUG USE ARE:  The increased possibility of getting AIDS or hepatitis (liver inflammation).  Having a baby born which is addicted to cocaine and must go through painful withdrawal including shaking, jerking, and crying in pain. Many of the  babies die. Other babies go through life with lifelong disabilities and learning problems. HOW TO STAY DRUG FREE ONCE YOU HAVE QUIT USING:  Develop healthy activities and form friends who do not use drugs.  Stay away from the drug scene.  Tell the pusher or former friend you have other better things to do.  Have ready excuses available about why you cannot use. For more help or information contact your local physician, clinic, hospital or dial 1-800-cocaine 2168886355(1-980-543-9924). Document Released: 06/13/2000 Document Revised: 09/08/2011 Document Reviewed: 02/02/2008 [facility] Patient Information 2014 [place], [place].   Chest Pain (Nonspecific) It is often hard to give a specific diagnosis for the cause of chest pain. There is always a chance that your pain could be related to something serious, such as a heart attack or a blood clot in the lungs. You need to follow up with your caregiver for further evaluation. CAUSES   Heartburn.  Pneumonia or bronchitis.  Anxiety or stress.  Inflammation around your heart (pericarditis) or lung (pleuritis or pleurisy).  A blood clot in the lung.  A collapsed lung (pneumothorax). It can develop suddenly on its own (spontaneous pneumothorax) or from injury (trauma) to the chest.  Shingles infection (herpes zoster virus). The chest wall is composed of bones, muscles, and cartilage. Any of these can be the source of the pain.  The bones can be bruised by injury.  The muscles or cartilage can be strained by coughing or overwork.  The cartilage can be affected by inflammation and become sore (costochondritis). DIAGNOSIS  Lab tests or other studies, such as X-rays, electrocardiography, stress testing, or cardiac imaging, may be needed to find the cause of your pain.  TREATMENT   Treatment depends on what may be causing your chest pain. Treatment may include:  Acid blockers for heartburn.  Anti-inflammatory medicine.  Pain medicine for  inflammatory conditions.  Antibiotics if an infection is present.  You may be advised to change lifestyle habits. This includes stopping smoking and avoiding alcohol, caffeine, and chocolate.  You may be advised to keep your head raised (elevated) when sleeping. This reduces the chance of acid going backward from your stomach into your esophagus.  Most of the time, nonspecific chest pain will improve within 2 to 3 days with rest and mild pain medicine. HOME CARE INSTRUCTIONS   If antibiotics were prescribed, take your antibiotics as directed. Finish them even if you start to feel better.  For the next few days, avoid physical activities that bring on chest pain. Continue physical activities as directed.  Do not smoke.  Avoid drinking alcohol.  Only take over-the-counter or prescription medicine for pain, discomfort, or fever as directed by your caregiver.  Follow your caregiver's suggestions for further testing if your chest pain does not go away.  Keep any follow-up appointments you made. If you do not go to an appointment, you could develop lasting (chronic) problems with pain. If there is any problem keeping an appointment, you must call to reschedule. SEEK MEDICAL CARE IF:   You think you are having problems from the medicine you are taking. Read your medicine instructions carefully.  Your chest pain does not go away, even after treatment.  You develop a rash with blisters on your chest. SEEK IMMEDIATE MEDICAL CARE IF:   You have increased chest pain or pain that spreads to your arm, neck, jaw, back, or abdomen.  You develop shortness of breath, an increasing cough, or you are coughing up blood.  You have severe back or abdominal pain, feel nauseous, or vomit.  You develop severe weakness, fainting, or chills.  You have a fever. THIS IS AN EMERGENCY. Do not wait to see if the pain will go away. Get medical help at once. Call your local emergency services (911 in [place]  Do not drive yourself to the hospital. MAKE SURE YOU:   Understand these instructions.  Will watch your condition.  Will get help right away if you are not doing well or get worse. Document Released: 03/26/2005 Document Revised: 09/08/2011 Document Reviewed: 01/20/2008 [facility] Patient Information 2014 [place], [place].

## 2013-07-08 NOTE — Progress Notes (Signed)
GXT echo performed.

## 2013-07-08 NOTE — Progress Notes (Signed)
Patient ID: [name] [name], male   DOB: 10/14/1960, 53 y.o.   MRN: [id number]   Chest Pain Unit Rounding Note SUBJECTIVE: No further chest pain.  Patient does report dyspnea.   . enoxaparin (LOVENOX) injection  40 mg Subcutaneous Q24H  . folic acid  1 mg Oral Daily  . multivitamin with minerals  1 tablet Oral Daily  . sodium chloride  3 mL Intravenous Q12H  . thiamine  100 mg Oral Daily   Or  . thiamine  100 mg Intravenous Daily     Filed Vitals:   07/07/13 1600 07/07/13 1612 07/07/13 2235 07/08/13 0617  BP: 156/92 156/92 140/73 127/76  Pulse: 56  65 64  Temp:   98.6 F (37 C) 98.7 F (37.1 C)  TempSrc:   Oral Oral  Resp: 20  18 18   SpO2: 95%  100% 100%   No intake or output data in the 24 hours ending 07/08/13 0858  LABS: Basic Metabolic Panel:  Recent Labs  78/29/56 1134  NA 141  K 4.4  CL 106  CO2 22  GLUCOSE 102*  BUN 9  CREATININE 0.86  CALCIUM 9.7   Liver Function Tests:  Recent Labs  07/07/13 1134  AST 21  ALT 23  ALKPHOS 58  BILITOT <0.2*  PROT 7.0  ALBUMIN 3.6   No results found for this basename: LIPASE, AMYLASE,  in the last 72 hours CBC:  Recent Labs  07/07/13 1134  WBC 7.4  HGB 15.0  HCT 44.2  MCV 82.5  PLT 491*   Cardiac Enzymes:  Recent Labs  07/07/13 1857 07/07/13 2342  TROPONINI <0.30 <0.30   BNP: No components found with this basename: POCBNP,  D-Dimer: No results found for this basename: DDIMER,  in the last 72 hours Hemoglobin A1C: No results found for this basename: HGBA1C,  in the last 72 hours Fasting Lipid Panel:  Recent Labs  07/07/13 1857  CHOL 165  HDL 71  LDLCALC 77  TRIG 83  CHOLHDL 2.3   Thyroid Function Tests:  Recent Labs  07/07/13 1857  TSH 3.387   Anemia Panel: No results found for this basename: VITAMINB12, FOLATE, FERRITIN, TIBC, IRON, RETICCTPCT,  in the last 72 hours  RADIOLOGY: Dg Chest 2 View  07/07/2013   CLINICAL DATA:  Chest pain and shortness of breath today, history  smoking  EXAM: CHEST  2 VIEW  COMPARISON:  02/04/2010  FINDINGS: Normal heart size, mediastinal contours, and pulmonary vascularity.  Lungs clear.  Bones unremarkable.  No pneumothorax.  IMPRESSION: Normal exam.   Electronically Signed   By: [name] M.D.   On: 07/07/2013 13:14    PHYSICAL EXAM General: NAD Neck: No JVD, no thyromegaly or thyroid nodule.  Lungs: Clear to auscultation bilaterally with normal respiratory effort. CV: Nondisplaced PMI.  Heart regular S1/S2, no S3/S4, no murmur.  No peripheral edema.  No carotid bruit.  Normal pedal pulses.  Abdomen: Soft, nontender, no hepatosplenomegaly, no distention.  Neurologic: Alert and oriented x 3.  Psych: Normal affect. Extremities: No clubbing or cyanosis.   TELEMETRY: Reviewed telemetry pt in NSR  ASSESSMENT AND PLAN: 53 yo with history of smoking, cocaine abuse, and HTN presented with chest pain in the setting of cocaine abuse.  He had CP on and off last night but today is chest pain-free.  ECG shows early repolarization and cardiac enzymes are negative. I think it would be reasonable to risk stratify him: will arrange for a stress echocardiogram today.  He can  walk on the treadmill.  Will get full baseline echo to ensure EF is normal (at risk for cocaine-related CMP).    [name] 07/08/2013 9:02 AM

## 2013-07-08 NOTE — Discharge Summary (Signed)
Name: [name] [name] MRN: [id number] DOB: Aug 17, 1960 53 y.o. 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     Disposition and follow-up:   Mr.[name] [name] was discharged from [facility] in Stable condition.  At the hospital follow up visit please address:  1.  Polysubstance abuse- please make sure patient has followed up with [facility] behavioral health or sought out some type of rehab program 2. Possible HTN- Please check blood pressure. It was elevated on admission here, but resolved overnight. Thought to be 2/2 cocaine use.   2.  Labs / imaging needed at time of follow-up: none  3.  Pending labs/ test needing follow-up: none  Follow-up Appointments: Follow-up Information   Follow up with [name], MD On 07/15/2013. (11:30am)    Specialty:  Internal Medicine   Contact information:   [address] [place] [address] [phone]       Discharge Instructions: Discharge Orders   Future Appointments Provider Department Dept Phone   07/15/2013 11:30 AM [facility] Covering Provider 2 [facility] [phone]   Future Orders Complete By Expires   Call MD for:  persistant dizziness or light-headedness  As directed    Call MD for:  persistant nausea and vomiting  As directed    Call MD for:  severe uncontrolled pain  As directed    Diet - low sodium heart healthy  As directed    Increase activity slowly  As directed       Consultations: Treatment Team:  Rounding Lbcardiology, MD- Dr. Jearld Pies  Procedures Performed:  Dg Chest 2 View  07/07/2013   CLINICAL DATA:  Chest pain and shortness of breath today, history smoking  EXAM: CHEST  2 VIEW  COMPARISON:  02/04/2010  FINDINGS: Normal heart size, mediastinal contours, and pulmonary vascularity.  Lungs clear.  Bones unremarkable.  No pneumothorax.  IMPRESSION: Normal exam.   Electronically Signed   By: [name] M.D.   On: 07/07/2013 13:14   2D Echo 07/08/2012:   Normal LV size with mild LV hypertrophy. EF 55-60%. Normal RV size and systolic function. No significant valvular abnormalities.  Stress Echo 07/08/2012: - Stress ECG conclusions: The stress ECG was normal. - Staged echo: Normal echo stress Impressions: - Normal study after maximal exercise.  Admission HPI:  This is a 52yo AAM with PMH polysubstance abuse who presents with c/o sharp chest pain x this morning.  Patient reports that he "partied" last night and drank a large amount of alcohol, smoked marijuana, and approx 1-1.5g cocaine. Patient went to bed at around 2am and woke up around 7am. He was somewhat sweaty when he woke up, and when he stood up he had sudden onset L sided sharp CP that was constant for 10-17mins. Pain was partially relieved when patient leaned over. He said he was breathing "fast" but did not have SOB. He remained sweaty, though unclear if the diaphoresis was associated with the chest pain. Patient reports that the CP resolved almost completely (to a  dull, mild ache) and he went to work where he had another episode of identical chest pain. He sat down after which time the pain resolved, lasted approx 5-10 mins. CP is not pleuritic and is not worsened by movement. Pt has no chest pain now. He had no N/V, SOB during these episodes. He has had a cough for "weeks" now that is nonproductive and unchanged recently. Denies F/C, changes to appetite, dysuria, leg swelling, abd pain. He has had some watery, nonbloody diarrhea for the  past few days, last normal stool was 4 days ago. Also reports having some issues with starting his urine stream with ?dysuria when he starts his stream.  In the ED, patient was hypertensive 178/104, rest of VSS. CMP wnl. CBC wnl. Trop neg x 1. UDS + cocaine and THC. CXR wnl.  Patient has poor social support and is interested in going to rehab for his drug addiction. He has previously been admitted to [facility] for two weeks (Dec 2013). He also endorses being depressed with transient intermittent thoughts of both SI and HI, though is not actively suicidal or homicidal now.  Hospital Course by problem list:   # Atypical chest pain: Patient was observed overnight for possible ACS vs cocaine induced vasospasm. This may represent MSK pain given patient does a lot of physical labor at work. ACS ruled out w/ three negative troponins. UDS + cocaine and marijuana. TSH wnl. HIV ab nonreactive. HbA1c 6.3%. Lipid panel wnl, LDL 77. EKG with some early repolarization. Echo showed preserved EF 55-60%, with grade 1 diastolic dysfuction. Patient remains euvolemic. Patient had a normal stress echo. Cardiology signed off and saw no need for outpatient follow up. [facility]  # Depression in setting of polysubstance abuse: Patient with depression that appears to be chronic and likely related to substance abuse. Not actively suicidal. Patient is interested in going to rehab and getting help. Will need close outpatient follow up, so patient was given resources for outpatient follow up by CSW. Patient monitored on CIWA, but did not require any ativan nor did he show signs of alcohol withdrawal. Patient was discharged to detox center for a short term stay.  # Elevated blood pressure: BPs elevated on admission (170s/low 100s) for which he received hydralazine 5mg  IV. BPs decreased significantly overnight (127/76) without any medications. BPs likely acutely elevated 2/2 cocaine use. Patient may  have uncontrolled HTN at baseline, though this may not be the case given normalized BP without treatment. Will need [facility]heck at the [facility].  Discharge Vitals:   BP 129/74  Pulse 72  Temp(Src) 98.3 F (36.8 C) (Oral)  Resp 18  SpO2 100%  Discharge Labs:  Results for orders placed during the hospital encounter of 07/07/13 (from the past 24 hour(s))  TROPONIN I     Status: None   Collection Time    07/07/13  6:57 PM      Result Value Range   Troponin I <0.30  <0.30 ng/mL  TSH     Status: None   Collection Time    07/07/13  6:57 PM      Result Value Range   TSH 3.387  0.350 - 4.500 uIU/mL  HEMOGLOBIN A1C     Status: Abnormal   Collection Time    07/07/13  6:57 PM      Result Value Range   Hemoglobin A1C 6.3 (*) <5.7 %   Mean Plasma Glucose 134 (*) <117 mg/dL  LIPID PANEL     Status: None   Collection Time    07/07/13  6:57 PM      Result Value Range   Cholesterol 165  0 - 200 mg/dL   Triglycerides 83  <409<150 mg/dL   HDL 71  >81>39 mg/dL   Total CHOL/HDL Ratio 2.3     VLDL 17  0 - 40 mg/dL   LDL Cholesterol 77  0 - 99 mg/dL  HIV ANTIBODY (ROUTINE TESTING)     Status: None   Collection Time    07/07/13  6:57 PM      Result Value Range   HIV NON REACTIVE  NON REACTIVE  TROPONIN I     Status: None   Collection Time    07/07/13 11:42 PM      Result Value Range   Troponin I <0.30  <0.30 ng/mL  GLUCOSE, CAPILLARY     Status: None   Collection Time    07/08/13  8:01 AM      Result Value Range   Glucose-Capillary 84  70 - 99 mg/dL   Comment 1 Notify RN     Comment 2 Documented in Chart      Signed: Windell Hummingbirdachel Maxwel Meadowcroft, MD 07/08/2013, 4:35 PM   Time Spent on Discharge: 35 minutes Services Ordered on Discharge: none Equipment Ordered on Discharge: none

## 2013-07-08 NOTE — Progress Notes (Signed)
Clinical Social Work Department BRIEF PSYCHOSOCIAL ASSESSMENT 07/08/2013  Patient:  [name] [name]     Account Number:  [id number]     Admit date:  07/07/2013  Clinical Social Worker:  Harless NakayamaAMBELAL,Beila Purdie, LCSWA  Date/Time:  07/08/2013 02:00 PM  Referred by:  Physician  Date Referred:  07/08/2013 Referred for  Substance Abuse   Other Referral:   Interview type:  Patient Other interview type:    PSYCHOSOCIAL DATA Living Status:  ALONE Admitted from facility:   Level of care:   Primary support name:   Primary support relationship to patient:   Degree of support available:   Pt identifies limited supports that are able to offer a good influence    CURRENT CONCERNS  Other Concerns:    SOCIAL WORK ASSESSMENT / PLAN CSW received call from MD stating they would like pt to be admitted to detox program from the hospital. CSW explained this could be difficult but CSW would call around. CSW spoke with pt and complete SBIRT. Pt informed CSW that he has been to treatment programs before and the "effects" lasted roughly one month after leaving the program. Pt identified no clear triggers to his use of alcohol and marijuana but that instead it was "people and places." CSW inquired if pt was wanting assitance to stop use. Pt stated he was ready to stop but he knows it will be difficult. CSW explained that MD is wanting pt to go to detox program. Pt is agreeable and is willing to try any facility. CSW has reached out to multiple inpatient treatment programs and faxed referral to [facility].   Assessment/plan status:  Psychosocial Support/Ongoing Assessment of Needs Other assessment/ plan:   Information/referral to community resources:   Pt provided with substance abuse treatment list    PATIENT'S/FAMILY'S RESPONSE TO PLAN OF CARE: Pt is agreeable to discharging to residential treatment program is possible       Sharol Harnessoonum Hiilei Gerst, LCSWA [phone]

## 2013-07-08 NOTE — Progress Notes (Signed)
  Echocardiogram Echocardiogram Stress Test has been performed.  Dorothey BasemanReel, Mirenda Baltazar M 07/08/2013, 11:22 AM

## 2013-07-10 NOTE — Discharge Summary (Signed)
I saw Mr. Debroah BallerDaughtrey on day of discharge and assisted in the discharge planning.

## 2013-07-15 ENCOUNTER — Inpatient Hospital Stay: Payer: Self-pay

## 2013-08-07 ENCOUNTER — Encounter (HOSPITAL_BASED_OUTPATIENT_CLINIC_OR_DEPARTMENT_OTHER): Payer: Self-pay | Admitting: Emergency Medicine

## 2013-08-07 ENCOUNTER — Emergency Department (HOSPITAL_BASED_OUTPATIENT_CLINIC_OR_DEPARTMENT_OTHER)
Admission: EM | Admit: 2013-08-07 | Discharge: 2013-08-08 | Disposition: A | Discharge status: Home / Self Care | Payer: Self-pay | Attending: Emergency Medicine | Admitting: Emergency Medicine

## 2013-08-07 ENCOUNTER — Emergency Department (HOSPITAL_BASED_OUTPATIENT_CLINIC_OR_DEPARTMENT_OTHER): Payer: Self-pay

## 2013-08-07 DIAGNOSIS — A088 Other specified intestinal infections: Secondary | ICD-10-CM | POA: Insufficient documentation

## 2013-08-07 DIAGNOSIS — I10 Essential (primary) hypertension: Secondary | ICD-10-CM | POA: Insufficient documentation

## 2013-08-07 DIAGNOSIS — A084 Viral intestinal infection, unspecified: Secondary | ICD-10-CM

## 2013-08-07 DIAGNOSIS — Z79899 Other long term (current) drug therapy: Secondary | ICD-10-CM | POA: Insufficient documentation

## 2013-08-07 DIAGNOSIS — K859 Acute pancreatitis without necrosis or infection, unspecified: Secondary | ICD-10-CM | POA: Insufficient documentation

## 2013-08-07 DIAGNOSIS — F17200 Nicotine dependence, unspecified, uncomplicated: Secondary | ICD-10-CM | POA: Insufficient documentation

## 2013-08-07 DIAGNOSIS — Z8659 Personal history of other mental and behavioral disorders: Secondary | ICD-10-CM | POA: Insufficient documentation

## 2013-08-07 DIAGNOSIS — R002 Palpitations: Secondary | ICD-10-CM | POA: Insufficient documentation

## 2013-08-07 DIAGNOSIS — Z8739 Personal history of other diseases of the musculoskeletal system and connective tissue: Secondary | ICD-10-CM | POA: Insufficient documentation

## 2013-08-07 DIAGNOSIS — R0602 Shortness of breath: Secondary | ICD-10-CM | POA: Insufficient documentation

## 2013-08-07 DIAGNOSIS — K219 Gastro-esophageal reflux disease without esophagitis: Secondary | ICD-10-CM | POA: Insufficient documentation

## 2013-08-07 LAB — COMPREHENSIVE METABOLIC PANEL
ALT: 23 U/L (ref 0–53)
AST: 21 U/L (ref 0–37)
Albumin: 3.9 g/dL (ref 3.5–5.2)
Alkaline Phosphatase: 60 U/L (ref 39–117)
BUN: 17 mg/dL (ref 6–23)
CHLORIDE: 100 meq/L (ref 96–112)
CO2: 24 meq/L (ref 19–32)
Calcium: 9.5 mg/dL (ref 8.4–10.5)
Creatinine, Ser: 0.8 mg/dL (ref 0.50–1.35)
GLUCOSE: 121 mg/dL — AB (ref 70–99)
Potassium: 4.5 mEq/L (ref 3.7–5.3)
SODIUM: 139 meq/L (ref 137–147)
Total Bilirubin: 0.3 mg/dL (ref 0.3–1.2)
Total Protein: 7.8 g/dL (ref 6.0–8.3)

## 2013-08-07 LAB — CBC WITH DIFFERENTIAL/PLATELET
Basophils Absolute: 0 10*3/uL (ref 0.0–0.1)
Basophils Relative: 0 % (ref 0–1)
Eosinophils Absolute: 0.2 10*3/uL (ref 0.0–0.7)
Eosinophils Relative: 2 % (ref 0–5)
HCT: 44.1 % (ref 39.0–52.0)
Hemoglobin: 14.6 g/dL (ref 13.0–17.0)
LYMPHS PCT: 9 % — AB (ref 12–46)
Lymphs Abs: 0.8 10*3/uL (ref 0.7–4.0)
MCH: 27.3 pg (ref 26.0–34.0)
MCHC: 33.1 g/dL (ref 30.0–36.0)
MCV: 82.6 fL (ref 78.0–100.0)
Monocytes Absolute: 0.8 10*3/uL (ref 0.1–1.0)
Monocytes Relative: 9 % (ref 3–12)
NEUTROS ABS: 6.8 10*3/uL (ref 1.7–7.7)
NEUTROS PCT: 79 % — AB (ref 43–77)
PLATELETS: 420 10*3/uL — AB (ref 150–400)
RBC: 5.34 MIL/uL (ref 4.22–5.81)
RDW: 15.4 % (ref 11.5–15.5)
WBC: 8.5 10*3/uL (ref 4.0–10.5)

## 2013-08-07 LAB — LIPASE, BLOOD: Lipase: 257 U/L — ABNORMAL HIGH (ref 11–59)

## 2013-08-07 LAB — URINALYSIS, ROUTINE W REFLEX MICROSCOPIC
Bilirubin Urine: NEGATIVE
GLUCOSE, UA: NEGATIVE mg/dL
Hgb urine dipstick: NEGATIVE
Ketones, ur: NEGATIVE mg/dL
Nitrite: NEGATIVE
PH: 5.5 (ref 5.0–8.0)
PROTEIN: NEGATIVE mg/dL
Specific Gravity, Urine: 1.03 (ref 1.005–1.030)
Urobilinogen, UA: 0.2 mg/dL (ref 0.0–1.0)

## 2013-08-07 LAB — URINE MICROSCOPIC-ADD ON

## 2013-08-07 MED ORDER — ONDANSETRON HCL 4 MG/2ML IJ SOLN
4.0000 mg | Freq: Once | INTRAMUSCULAR | Status: AC
  Administered 2013-08-07: 4 mg via INTRAVENOUS
  Filled 2013-08-07: qty 2

## 2013-08-07 MED ORDER — SODIUM CHLORIDE 0.9 % IV BOLUS (SEPSIS)
1000.0000 mL | Freq: Once | INTRAVENOUS | Status: AC
  Administered 2013-08-07: 1000 mL via INTRAVENOUS

## 2013-08-07 NOTE — ED Provider Notes (Signed)
CSN: [id number]     Arrival date & time 08/07/13  1814 History   This chart was scribed for Hanley SeamenJohn L Anaysia Germer, MD by Donne Anonayla Curran, ED Scribe. This patient was seen in room MH01/MH01 and the patient's care was started at 2254.   First MD Initiated Contact with Patient 08/07/13 2254     Chief Complaint  Patient presents with  . Abdominal Pain    The history is provided by the patient. No language interpreter was used.   HPI Comments: [name] [name] is a 53 y.o. male with hx of drug and alcohol abuse who [facility], who presents to the Emergency Department complaining of 11 hours of generalized abdominal pain with sharp epigastric pain with movement. Symptoms are moderate. He reports associated nausea, diarrhea, chills, SOB, and palpitations (baseline). He denies emesis, fever or any other symptoms. He tried a laxative with mild relief of symptoms.   Past Medical History  Diagnosis Date  . Arthritis   . GERD (gastroesophageal reflux disease)   . Substance abuse     cocaine, marijuana, tobacco, alcohol  . Hypertension   . H/O suicide attempt     when he was young  . Depression    Past Surgical History  Procedure Laterality Date  . Hypospadias correction     Family History  Problem Relation Age of Onset  . Stroke Mother   . Diabetes Sister   . Diabetes Mother   . Stroke Father   . Hypertension Mother   . Hypertension Father   . Hypertension Sister   . Stroke Sister   . Heart disease Sister    History  Substance Use Topics  . Smoking status: Current Every Day Smoker -- 1.50 packs/day for 40 years    Types: Cigarettes  . Smokeless tobacco: Never Used  . Alcohol Use: Yes     Comment: in recovery- [facility], 2-3 40oz beer daily    Review of Systems  Respiratory: Positive for shortness of breath.   Cardiovascular: Positive for palpitations.  Gastrointestinal: Positive for nausea, abdominal pain and diarrhea. Negative for vomiting.  All other systems reviewed and  are negative.    Allergies  Review of patient's allergies indicates no known allergies.  Home Medications   Current Outpatient Rx  Name  Route  Sig  Dispense  Refill  . omeprazole (PRILOSEC) 10 MG capsule   Oral   Take 10 mg by mouth daily.          BP 130/72  Pulse 102  Temp(Src) 99 F (37.2 C) (Oral)  Resp 18  Ht 5\' 10"  (1.778 m)  Wt 225 lb (102.059 kg)  BMI 32.28 kg/m2  SpO2 97%    Physical Exam  Nursing note and vitals reviewed. Constitutional: He appears well-developed and well-nourished. No distress.  HENT:  Head: Normocephalic and atraumatic.  Mouth/Throat: Oropharynx is clear and moist.  Eyes: Conjunctivae and EOM are normal. Pupils are equal, round, and reactive to light.  Arcus senilis bilaterally  Neck: Normal range of motion. Neck supple. No tracheal deviation present.  Cardiovascular: Normal rate, regular rhythm and normal heart sounds.  Exam reveals no gallop and no friction rub.   No murmur heard. Pulmonary/Chest: Effort normal and breath sounds normal. No respiratory distress. He has no wheezes. He has no rales.  Abdominal: Soft. Bowel sounds are normal. He exhibits distension (mild). There is tenderness in the epigastric area and suprapubic area. There is no rebound and no guarding.  Musculoskeletal: Normal range of  motion.  Neurological: He is alert.  Skin: Skin is warm and dry.  Psychiatric: He has a normal mood and affect. His behavior is normal.      ED Course  Procedures (including critical care time) DIAGNOSTIC STUDIES: Oxygen Saturation is 97% on RA, adequate by my interpretation.    COORDINATION OF CARE: 10:56 PM Discussed treatment plan which includes CBC with differential, comprehensive metabolic panel, blood lipase, urinalysis, abdominal xray with pt at bedside and pt agreed to plan.   MDM   Nursing notes and vitals signs, including pulse oximetry, reviewed.  Summary of this visit's results, reviewed by myself:  Labs:   Results for orders placed during the hospital encounter of 08/07/13 (from the past 24 hour(s))  URINALYSIS, ROUTINE W REFLEX MICROSCOPIC     Status: Abnormal   Collection Time    08/07/13  8:38 PM      Result Value Range   Color, Urine YELLOW  YELLOW   APPearance CLEAR  CLEAR   Specific Gravity, Urine 1.030  1.005 - 1.030   pH 5.5  5.0 - 8.0   Glucose, UA NEGATIVE  NEGATIVE mg/dL   Hgb urine dipstick NEGATIVE  NEGATIVE   Bilirubin Urine NEGATIVE  NEGATIVE   Ketones, ur NEGATIVE  NEGATIVE mg/dL   Protein, ur NEGATIVE  NEGATIVE mg/dL   Urobilinogen, UA 0.2  0.0 - 1.0 mg/dL   Nitrite NEGATIVE  NEGATIVE   Leukocytes, UA TRACE (*) NEGATIVE  URINE MICROSCOPIC-ADD ON     Status: Abnormal   Collection Time    08/07/13  8:38 PM      Result Value Range   Squamous Epithelial / LPF RARE  RARE   WBC, UA 3-6  <3 WBC/hpf   Bacteria, UA FEW (*) RARE   Urine-Other MUCOUS PRESENT    CBC WITH DIFFERENTIAL     Status: Abnormal   Collection Time    08/07/13  9:53 PM      Result Value Range   WBC 8.5  4.0 - 10.5 K/uL   RBC 5.34  4.22 - 5.81 MIL/uL   Hemoglobin 14.6  13.0 - 17.0 g/dL   HCT 16.1  09.6 - 04.5 %   MCV 82.6  78.0 - 100.0 fL   MCH 27.3  26.0 - 34.0 pg   MCHC 33.1  30.0 - 36.0 g/dL   RDW 40.9  81.1 - 91.4 %   Platelets 420 (*) 150 - 400 K/uL   Neutrophils Relative % 79 (*) 43 - 77 %   Neutro Abs 6.8  1.7 - 7.7 K/uL   Lymphocytes Relative 9 (*) 12 - 46 %   Lymphs Abs 0.8  0.7 - 4.0 K/uL   Monocytes Relative 9  3 - 12 %   Monocytes Absolute 0.8  0.1 - 1.0 K/uL   Eosinophils Relative 2  0 - 5 %   Eosinophils Absolute 0.2  0.0 - 0.7 K/uL   Basophils Relative 0  0 - 1 %   Basophils Absolute 0.0  0.0 - 0.1 K/uL  COMPREHENSIVE METABOLIC PANEL     Status: Abnormal   Collection Time    08/07/13  9:53 PM      Result Value Range   Sodium 139  137 - 147 mEq/L   Potassium 4.5  3.7 - 5.3 mEq/L   Chloride 100  96 - 112 mEq/L   CO2 24  19 - 32 mEq/L   Glucose, Bld 121 (*) 70 - 99 mg/dL    BUN  17  6 - 23 mg/dL   Creatinine, Ser 1.61  0.50 - 1.35 mg/dL   Calcium 9.5  8.4 - 09.6 mg/dL   Total Protein 7.8  6.0 - 8.3 g/dL   Albumin 3.9  3.5 - 5.2 g/dL   AST 21  0 - 37 U/L   ALT 23  0 - 53 U/L   Alkaline Phosphatase 60  39 - 117 U/L   Total Bilirubin 0.3  0.3 - 1.2 mg/dL   GFR calc non Af Amer >90  >90 mL/min   GFR calc Af Amer >90  >90 mL/min  LIPASE, BLOOD     Status: Abnormal   Collection Time    08/07/13  9:53 PM      Result Value Range   Lipase 257 (*) 11 - 59 U/L    Imaging Studies: Dg Abd Acute W/chest  08/07/2013   CLINICAL DATA:  Abdominal pain. Sharp epigastric pain. Nausea, diarrhea and chills.  EXAM: ACUTE ABDOMEN SERIES (ABDOMEN 2 VIEW & CHEST 1 VIEW)  COMPARISON:  DG CHEST 2 VIEW dated 07/07/2013  FINDINGS: Basilar atelectasis present in the lungs. No free air underneath the hemidiaphragms. Fluid levels are present within small bowel and colon. No dilated loops of bowel. Calcifications are present in the anatomic pelvis, likely within the prostate gland. No definite organomegaly. Paucity of distal bowel gas.  IMPRESSION: 1. Multiple colonic and small bowel air-fluid levels without dilation. Although nonspecific, this is most commonly associated with infection or enteritis. 2. Basilar atelectasis in the lungs.   Electronically Signed   By: Andreas Newport M.D.   On: 08/07/2013 23:41   2:11 AM Patient feeling better after rehydration. He likely has both a mild pancreatitis consistent with his history of alcoholism as well as acute gastroenteritis. Other patients at [facility] have had a similar illness recently.  I personally performed the services described in this documentation, which was scribed in my presence.  The recorded information has been reviewed and is accurate.    Hanley Seamen, MD 08/08/13 [phone]

## 2013-08-07 NOTE — ED Notes (Signed)
Patient here with abd pain, chills, nausea, body aches. [facility]

## 2013-08-08 MED ORDER — ONDANSETRON 8 MG PO TBDP: 8.0000 mg | ORAL_TABLET | Freq: Three times a day (TID) | ORAL | Status: AC | PRN

## 2013-08-08 NOTE — ED Notes (Signed)
Rx x 1 given for zofran- pt advised to stay on clear liquid diet until abd sx resolve

## 2013-08-08 NOTE — ED Notes (Signed)
MD at bedside. Pt given sprite to drink per vorb from [facility] [place]

## 2013-08-08 NOTE — Discharge Instructions (Signed)
Acute Pancreatitis °Acute pancreatitis is a disease in which the pancreas becomes suddenly inflamed. The pancreas is a large gland located behind your stomach. The pancreas produces enzymes that help digest food. The pancreas also releases the hormones glucagon and insulin that help regulate blood sugar. Damage to the pancreas occurs when the digestive enzymes from the pancreas are activated and begin attacking the pancreas before being released into the intestine. Most acute attacks last a couple of days and can cause serious complications. Some people become dehydrated and develop low blood pressure. In severe cases, bleeding into the pancreas can lead to shock and can be life-threatening. The lungs, heart, and kidneys may fail. °CAUSES  °Pancreatitis can happen to anyone. In some cases, the cause is unknown. Most cases are caused by: °· Alcohol abuse. °· Gallstones. °Other less common causes are: °· Certain medicines. °· Exposure to certain chemicals. °· Infection. °· Damage caused by an accident (trauma). °· Abdominal surgery. °SYMPTOMS  °· Pain in the upper abdomen that may radiate to the back. °· Tenderness and swelling of the abdomen. °· Nausea and vomiting. °DIAGNOSIS  °Your caregiver will perform a physical exam. Blood and stool tests may be done to confirm the diagnosis. Imaging tests may also be done, such as X-rays, CT scans, or an ultrasound of the abdomen. °TREATMENT  °Treatment usually requires a stay in the hospital. Treatment may include: °· Pain medicine. °· Fluid replacement through an intravenous line (IV). °· Placing a tube in the stomach to remove stomach contents and control vomiting. °· Not eating for 3 or 4 days. This gives your pancreas a rest, because enzymes are not being produced that can cause further damage. °· Antibiotic medicines if your condition is caused by an infection. °· Surgery of the pancreas or gallbladder. °HOME CARE INSTRUCTIONS  °· Follow the diet advised by your  caregiver. This may involve avoiding alcohol and decreasing the amount of fat in your diet. °· Eat smaller, more frequent meals. This reduces the amount of digestive juices the pancreas produces. °· Drink enough fluids to keep your urine clear or pale yellow. °· Only take over-the-counter or prescription medicines as directed by your caregiver. °· Avoid drinking alcohol if it caused your condition. °· Do not smoke. °· Get plenty of rest. °· Check your blood sugar at home as directed by your caregiver. °· Keep all follow-up appointments as directed by your caregiver. °SEEK MEDICAL CARE IF:  °· You do not recover as quickly as expected. °· You develop new or worsening symptoms. °· You have persistent pain, weakness, or nausea. °· You recover and then have another episode of pain. °SEEK IMMEDIATE MEDICAL CARE IF:  °· You are unable to eat or keep fluids down. °· Your pain becomes severe. °· You have a fever or persistent symptoms for more than 2 to 3 days. °· You have a fever and your symptoms suddenly get worse. °· Your skin or the white part of your eyes turn yellow (jaundice). °· You develop vomiting. °· You feel dizzy, or you faint. °· Your blood sugar is high (over 300 mg/dL). °MAKE SURE YOU:  °· Understand these instructions. °· Will watch your condition. °· Will get help right away if you are not doing well or get worse. °Document Released: 06/16/2005 Document Revised: 12/16/2011 Document Reviewed: 09/25/2011 °[employer]® Patient Information ©2014 [employer]. ° °

## 2013-08-09 LAB — URINE CULTURE
Colony Count: NO GROWTH
Culture: NO GROWTH

## 2015-09-25 IMAGING — CR DG CHEST 2V
2 series · 2 of 2 positions shown · non-contrast
Comparison: 02/04/2010

CLINICAL DATA: Chest pain and shortness of breath today, history
smoking

EXAM:
CHEST  2 VIEW

[w chest pa]
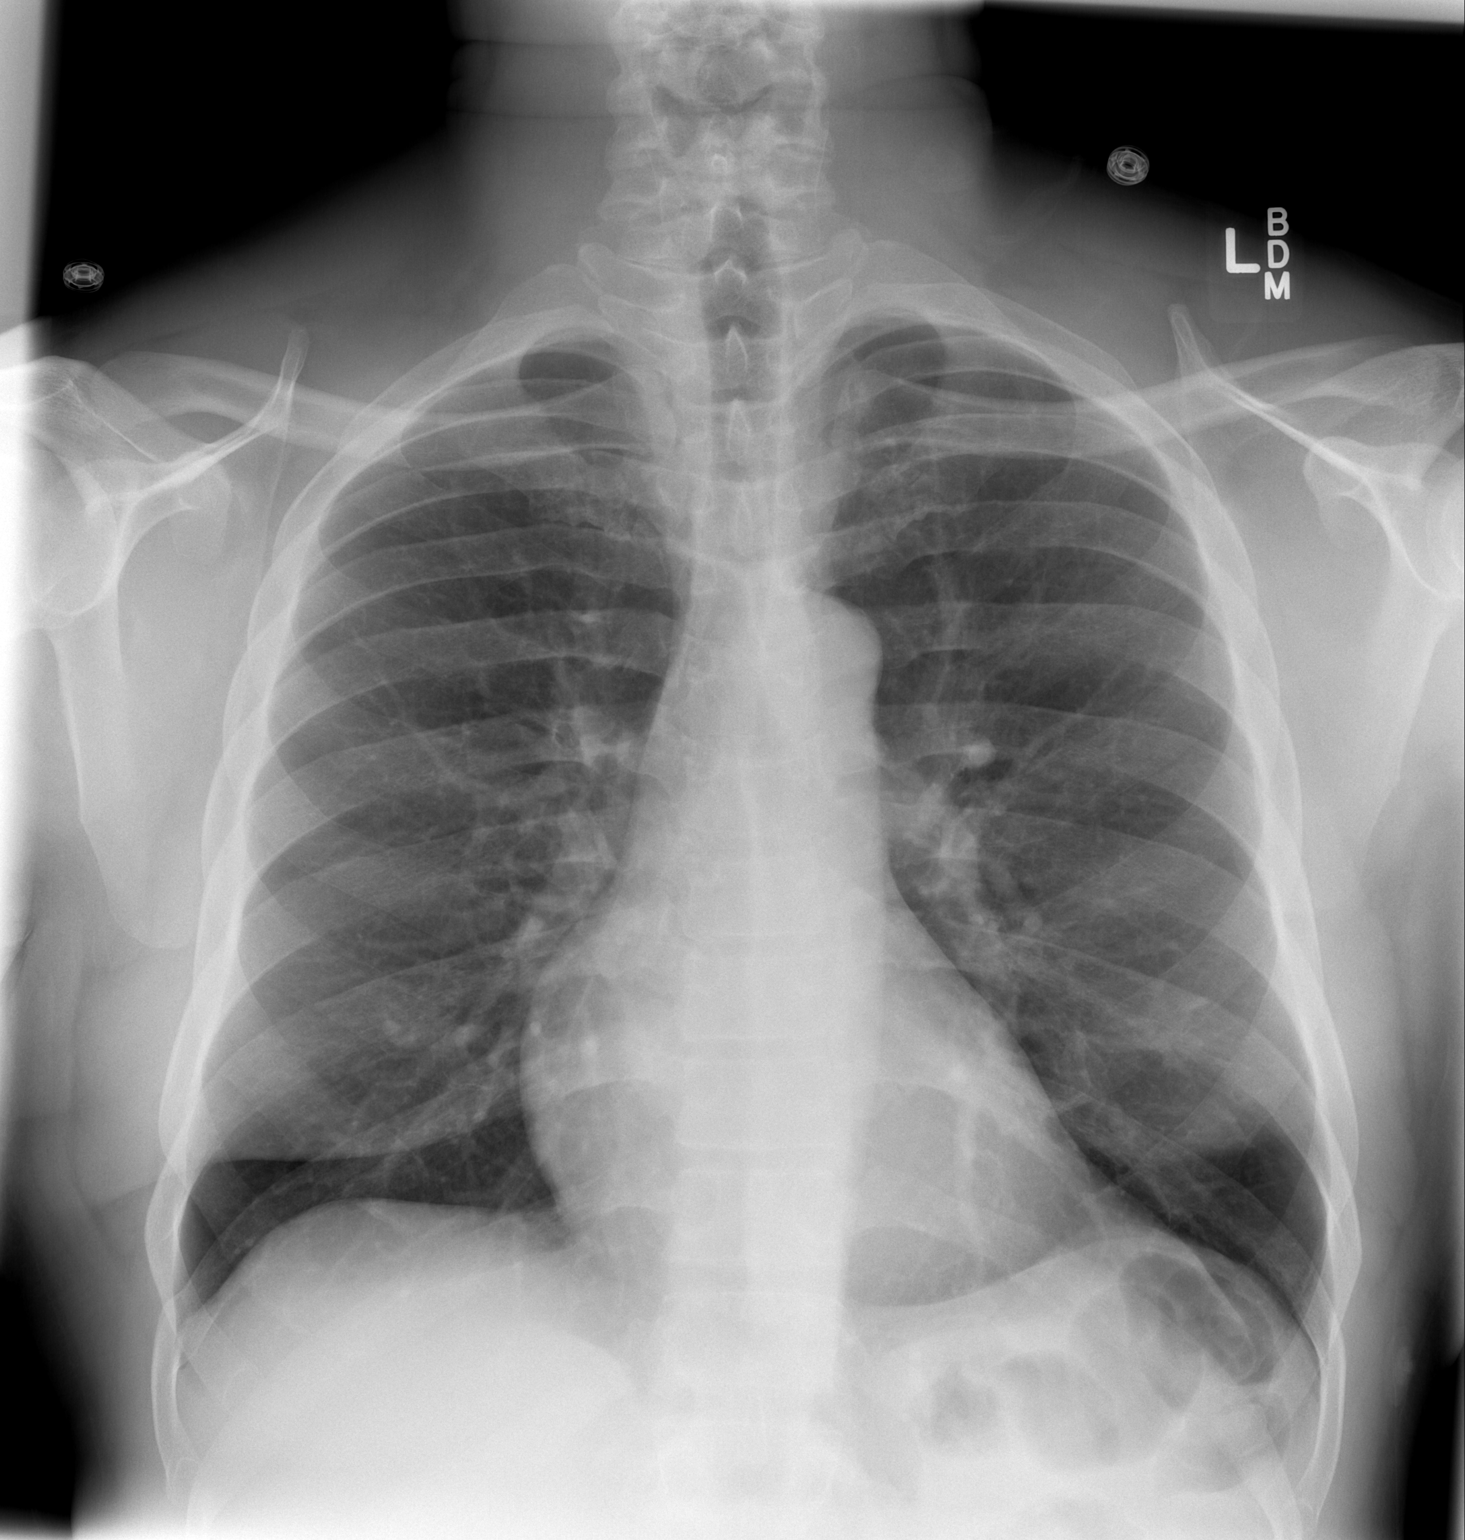

[w chest lat]
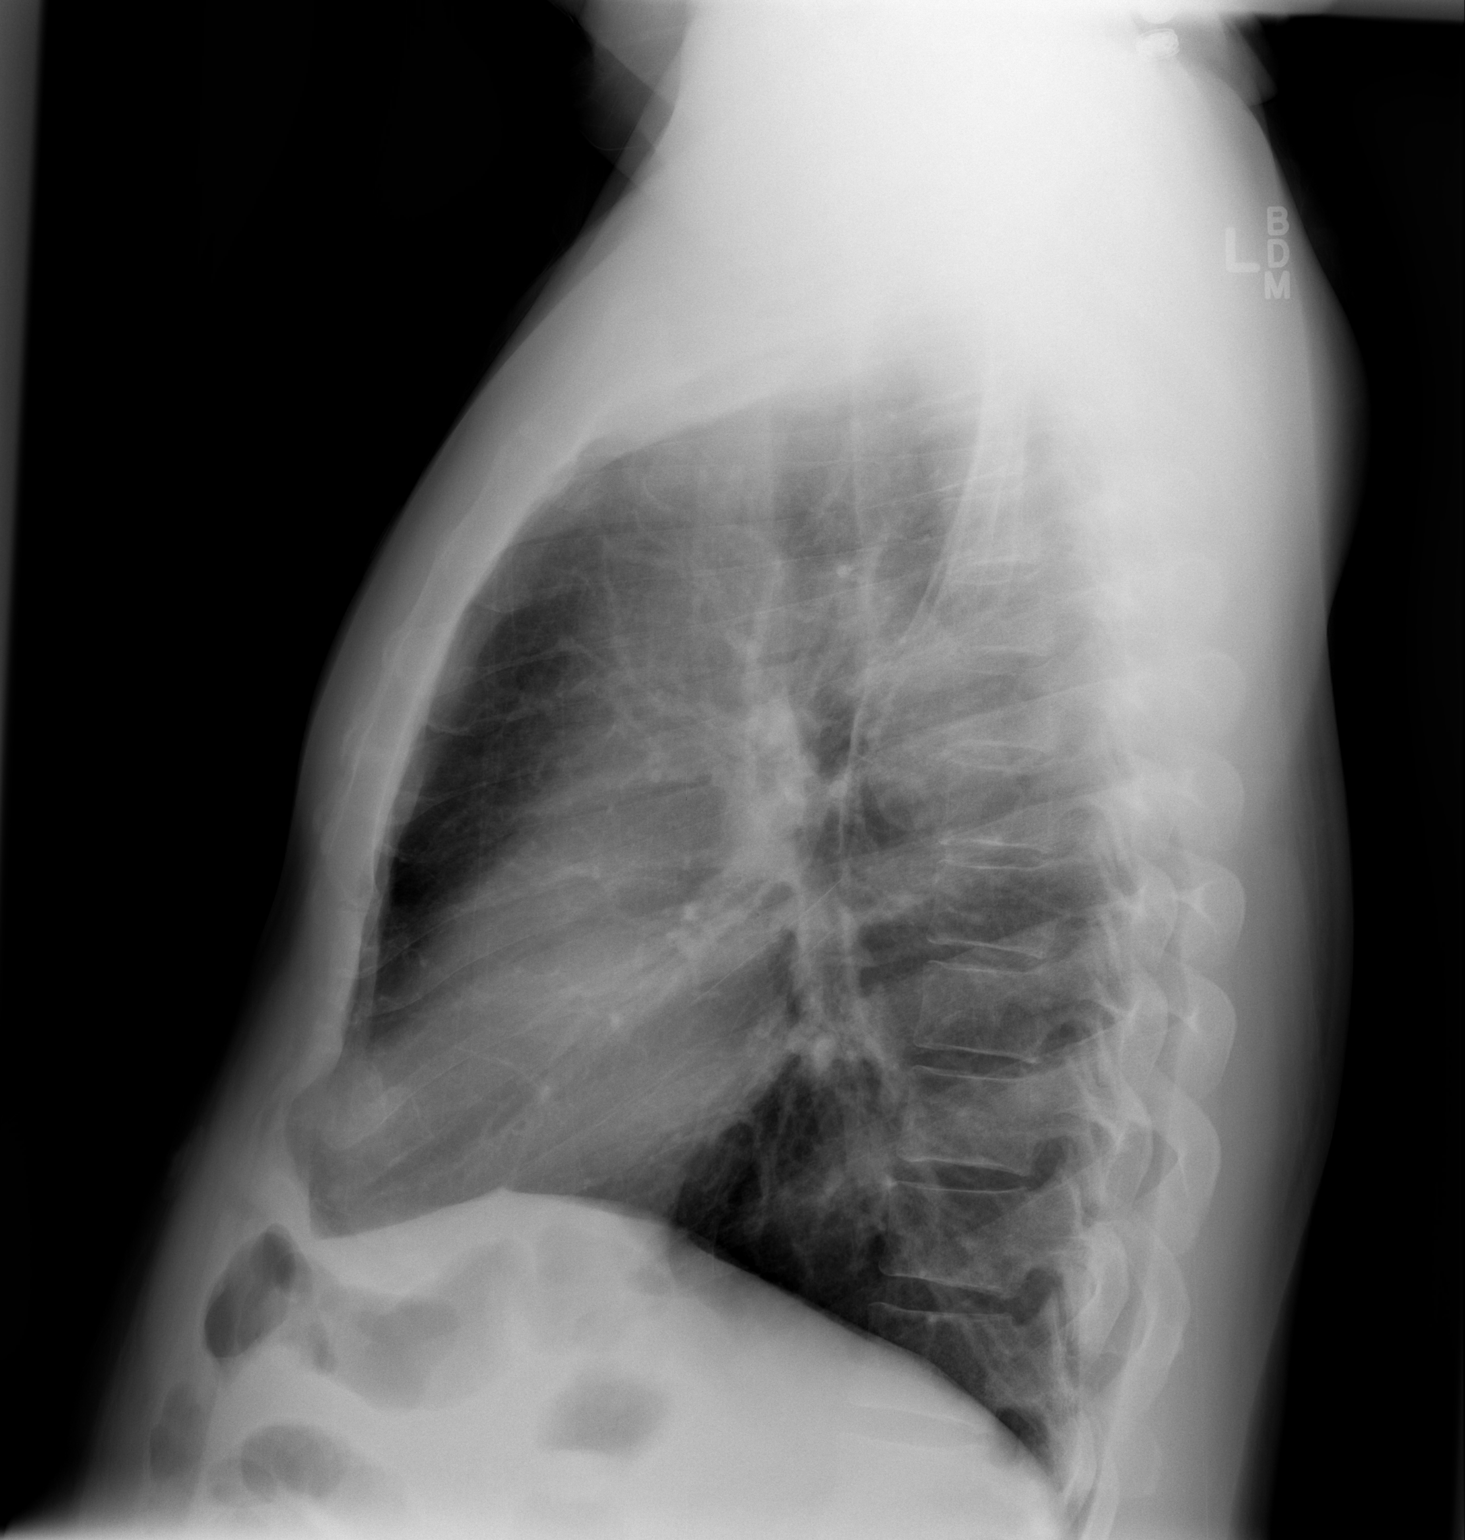

[2 of 2 positions shown; findings below may reference images not displayed]

FINDINGS: Normal heart size, mediastinal contours, and pulmonary vascularity.

Lungs clear.

Bones unremarkable.

No pneumothorax.
IMPRESSION: Normal exam.
